# Patient Record
Sex: Female | Born: 1935 | Race: White | Hispanic: No | Smoking: Never smoker
Health system: Southern US, Community
[De-identification: ages and names within clinical notes are randomized; demographics above are authoritative.]

## PROBLEM LIST (undated history)

## (undated) DIAGNOSIS — N816 Rectocele: Secondary | ICD-10-CM

## (undated) DIAGNOSIS — N879 Dysplasia of cervix uteri, unspecified: Secondary | ICD-10-CM

## (undated) DIAGNOSIS — IMO0002 Reserved for concepts with insufficient information to code with codable children: Secondary | ICD-10-CM

## (undated) DIAGNOSIS — K219 Gastro-esophageal reflux disease without esophagitis: Secondary | ICD-10-CM

## (undated) DIAGNOSIS — I10 Essential (primary) hypertension: Secondary | ICD-10-CM

## (undated) DIAGNOSIS — I38 Endocarditis, valve unspecified: Secondary | ICD-10-CM

## (undated) DIAGNOSIS — I639 Cerebral infarction, unspecified: Secondary | ICD-10-CM

## (undated) HISTORY — DX: Cerebral infarction, unspecified: I63.9

## (undated) HISTORY — PX: COLPOSCOPY: SHX161

## (undated) HISTORY — PX: OTHER SURGICAL HISTORY: SHX169

## (undated) HISTORY — DX: Dysplasia of cervix uteri, unspecified: N87.9

## (undated) HISTORY — PX: APPENDECTOMY: SHX54

## (undated) HISTORY — PX: BLADDER SUSPENSION: SHX72

## (undated) HISTORY — DX: Gastro-esophageal reflux disease without esophagitis: K21.9

## (undated) HISTORY — DX: Reserved for concepts with insufficient information to code with codable children: IMO0002

## (undated) HISTORY — DX: Endocarditis, valve unspecified: I38

## (undated) HISTORY — PX: TOTAL HIP ARTHROPLASTY: SHX124

## (undated) HISTORY — DX: Rectocele: N81.6

## (undated) HISTORY — DX: Essential (primary) hypertension: I10

## (undated) HISTORY — PX: INGUINAL HERNIA REPAIR: SUR1180

---

## 1989-03-24 HISTORY — PX: TOTAL ABDOMINAL HYSTERECTOMY: SHX209

## 1997-10-12 ENCOUNTER — Ambulatory Visit (HOSPITAL_COMMUNITY): Admission: RE | Admit: 1997-10-12 | Discharge: 1997-10-12 | Payer: Self-pay | Admitting: Otolaryngology

## 1998-12-11 ENCOUNTER — Ambulatory Visit (HOSPITAL_COMMUNITY): Admission: RE | Admit: 1998-12-11 | Discharge: 1998-12-11 | Payer: Self-pay | Admitting: Gastroenterology

## 1998-12-11 ENCOUNTER — Encounter: Payer: Self-pay | Admitting: Gastroenterology

## 1998-12-11 ENCOUNTER — Encounter (INDEPENDENT_AMBULATORY_CARE_PROVIDER_SITE_OTHER): Payer: Self-pay

## 1999-02-27 ENCOUNTER — Other Ambulatory Visit: Admission: RE | Admit: 1999-02-27 | Discharge: 1999-02-27 | Payer: Self-pay | Admitting: Obstetrics & Gynecology

## 1999-07-25 ENCOUNTER — Ambulatory Visit (HOSPITAL_COMMUNITY): Admission: RE | Admit: 1999-07-25 | Discharge: 1999-07-25 | Payer: Self-pay | Admitting: Otolaryngology

## 1999-07-25 ENCOUNTER — Encounter: Payer: Self-pay | Admitting: Otolaryngology

## 1999-08-13 ENCOUNTER — Other Ambulatory Visit: Admission: RE | Admit: 1999-08-13 | Discharge: 1999-08-13 | Payer: Self-pay | Admitting: Otolaryngology

## 1999-10-17 ENCOUNTER — Encounter: Admission: RE | Admit: 1999-10-17 | Discharge: 1999-10-17 | Payer: Self-pay | Admitting: Internal Medicine

## 1999-10-17 ENCOUNTER — Encounter: Payer: Self-pay | Admitting: Internal Medicine

## 1999-11-05 ENCOUNTER — Encounter: Admission: RE | Admit: 1999-11-05 | Discharge: 1999-12-11 | Payer: Self-pay | Admitting: Neurosurgery

## 2000-01-14 ENCOUNTER — Ambulatory Visit (HOSPITAL_COMMUNITY): Admission: RE | Admit: 2000-01-14 | Discharge: 2000-01-14 | Payer: Self-pay | Admitting: Neurosurgery

## 2000-01-14 ENCOUNTER — Encounter: Payer: Self-pay | Admitting: Neurosurgery

## 2000-01-28 ENCOUNTER — Encounter: Payer: Self-pay | Admitting: Neurosurgery

## 2000-01-28 ENCOUNTER — Ambulatory Visit (HOSPITAL_COMMUNITY): Admission: RE | Admit: 2000-01-28 | Discharge: 2000-01-28 | Payer: Self-pay | Admitting: Neurosurgery

## 2000-02-10 ENCOUNTER — Encounter: Payer: Self-pay | Admitting: Neurosurgery

## 2000-02-10 ENCOUNTER — Ambulatory Visit (HOSPITAL_COMMUNITY): Admission: RE | Admit: 2000-02-10 | Discharge: 2000-02-10 | Payer: Self-pay | Admitting: Neurosurgery

## 2000-04-01 ENCOUNTER — Encounter: Payer: Self-pay | Admitting: Neurosurgery

## 2000-04-01 ENCOUNTER — Ambulatory Visit (HOSPITAL_COMMUNITY): Admission: RE | Admit: 2000-04-01 | Discharge: 2000-04-01 | Payer: Self-pay | Admitting: Neurosurgery

## 2000-04-08 ENCOUNTER — Encounter: Admission: RE | Admit: 2000-04-08 | Discharge: 2000-07-07 | Payer: Self-pay | Admitting: Neurosurgery

## 2000-06-09 ENCOUNTER — Encounter: Payer: Self-pay | Admitting: Gastroenterology

## 2000-06-09 ENCOUNTER — Ambulatory Visit (HOSPITAL_COMMUNITY): Admission: RE | Admit: 2000-06-09 | Discharge: 2000-06-09 | Payer: Self-pay | Admitting: Gastroenterology

## 2000-10-05 ENCOUNTER — Encounter: Admission: RE | Admit: 2000-10-05 | Discharge: 2000-10-05 | Payer: Self-pay | Admitting: Orthopedic Surgery

## 2000-10-07 ENCOUNTER — Ambulatory Visit (HOSPITAL_BASED_OUTPATIENT_CLINIC_OR_DEPARTMENT_OTHER): Admission: RE | Admit: 2000-10-07 | Discharge: 2000-10-08 | Payer: Self-pay | Admitting: Orthopedic Surgery

## 2001-03-24 HISTORY — PX: CHOLECYSTECTOMY: SHX55

## 2001-04-06 ENCOUNTER — Other Ambulatory Visit: Admission: RE | Admit: 2001-04-06 | Discharge: 2001-04-06 | Payer: Self-pay | Admitting: Obstetrics and Gynecology

## 2001-05-04 ENCOUNTER — Encounter: Admission: RE | Admit: 2001-05-04 | Discharge: 2001-05-04 | Payer: Self-pay | Admitting: Otolaryngology

## 2001-05-04 ENCOUNTER — Encounter: Payer: Self-pay | Admitting: Otolaryngology

## 2001-06-02 ENCOUNTER — Ambulatory Visit (HOSPITAL_COMMUNITY): Admission: RE | Admit: 2001-06-02 | Discharge: 2001-06-02 | Payer: Self-pay | Admitting: Cardiology

## 2001-07-27 ENCOUNTER — Encounter: Admission: RE | Admit: 2001-07-27 | Discharge: 2001-07-27 | Payer: Self-pay | Admitting: Internal Medicine

## 2001-07-27 ENCOUNTER — Encounter: Payer: Self-pay | Admitting: Internal Medicine

## 2001-07-29 ENCOUNTER — Encounter (INDEPENDENT_AMBULATORY_CARE_PROVIDER_SITE_OTHER): Payer: Self-pay

## 2001-07-29 ENCOUNTER — Inpatient Hospital Stay (HOSPITAL_COMMUNITY): Admission: RE | Admit: 2001-07-29 | Discharge: 2001-08-06 | Payer: Self-pay | Admitting: General Surgery

## 2001-07-30 ENCOUNTER — Encounter: Payer: Self-pay | Admitting: Gastroenterology

## 2001-07-30 ENCOUNTER — Encounter: Payer: Self-pay | Admitting: General Surgery

## 2001-08-02 ENCOUNTER — Encounter: Payer: Self-pay | Admitting: General Surgery

## 2001-08-05 ENCOUNTER — Encounter: Payer: Self-pay | Admitting: General Surgery

## 2002-05-16 ENCOUNTER — Encounter: Payer: Self-pay | Admitting: Orthopedic Surgery

## 2002-05-18 ENCOUNTER — Inpatient Hospital Stay (HOSPITAL_COMMUNITY): Admission: RE | Admit: 2002-05-18 | Discharge: 2002-05-24 | Payer: Self-pay | Admitting: Orthopedic Surgery

## 2002-05-18 ENCOUNTER — Encounter: Payer: Self-pay | Admitting: Orthopedic Surgery

## 2002-05-24 ENCOUNTER — Inpatient Hospital Stay (HOSPITAL_COMMUNITY)
Admission: RE | Admit: 2002-05-24 | Discharge: 2002-05-28 | Payer: Self-pay | Admitting: Physical Medicine & Rehabilitation

## 2002-07-26 ENCOUNTER — Inpatient Hospital Stay (HOSPITAL_COMMUNITY): Admission: EM | Admit: 2002-07-26 | Discharge: 2002-08-01 | Payer: Self-pay | Admitting: Emergency Medicine

## 2002-07-27 ENCOUNTER — Encounter: Payer: Self-pay | Admitting: Gastroenterology

## 2002-07-29 ENCOUNTER — Encounter: Payer: Self-pay | Admitting: Internal Medicine

## 2002-07-30 ENCOUNTER — Encounter: Payer: Self-pay | Admitting: Internal Medicine

## 2003-04-07 ENCOUNTER — Inpatient Hospital Stay (HOSPITAL_COMMUNITY): Admission: RE | Admit: 2003-04-07 | Discharge: 2003-04-12 | Payer: Self-pay | Admitting: Orthopedic Surgery

## 2003-12-27 ENCOUNTER — Other Ambulatory Visit: Admission: RE | Admit: 2003-12-27 | Discharge: 2003-12-27 | Payer: Self-pay | Admitting: Obstetrics and Gynecology

## 2005-01-09 ENCOUNTER — Other Ambulatory Visit: Admission: RE | Admit: 2005-01-09 | Discharge: 2005-01-09 | Payer: Self-pay | Admitting: Obstetrics and Gynecology

## 2005-01-31 ENCOUNTER — Ambulatory Visit (HOSPITAL_COMMUNITY): Admission: RE | Admit: 2005-01-31 | Discharge: 2005-01-31 | Payer: Self-pay | Admitting: Obstetrics and Gynecology

## 2005-12-23 ENCOUNTER — Encounter: Admission: RE | Admit: 2005-12-23 | Discharge: 2006-01-08 | Payer: Self-pay | Admitting: Internal Medicine

## 2006-02-02 ENCOUNTER — Ambulatory Visit (HOSPITAL_COMMUNITY): Admission: RE | Admit: 2006-02-02 | Discharge: 2006-02-02 | Payer: Self-pay | Admitting: Obstetrics and Gynecology

## 2007-02-12 ENCOUNTER — Other Ambulatory Visit: Admission: RE | Admit: 2007-02-12 | Discharge: 2007-02-12 | Payer: Self-pay | Admitting: Obstetrics and Gynecology

## 2007-02-16 ENCOUNTER — Ambulatory Visit (HOSPITAL_COMMUNITY): Admission: RE | Admit: 2007-02-16 | Discharge: 2007-02-16 | Payer: Self-pay | Admitting: Obstetrics and Gynecology

## 2007-02-24 ENCOUNTER — Encounter: Admission: RE | Admit: 2007-02-24 | Discharge: 2007-02-24 | Payer: Self-pay | Admitting: Obstetrics and Gynecology

## 2007-08-03 ENCOUNTER — Encounter: Admission: RE | Admit: 2007-08-03 | Discharge: 2007-08-03 | Payer: Self-pay | Admitting: Internal Medicine

## 2007-09-14 ENCOUNTER — Emergency Department (HOSPITAL_COMMUNITY): Admission: EM | Admit: 2007-09-14 | Discharge: 2007-09-14 | Payer: Self-pay | Admitting: Emergency Medicine

## 2008-02-15 ENCOUNTER — Ambulatory Visit: Payer: Self-pay | Admitting: Obstetrics and Gynecology

## 2008-02-24 ENCOUNTER — Ambulatory Visit: Payer: Self-pay | Admitting: Obstetrics and Gynecology

## 2009-01-18 ENCOUNTER — Encounter: Admission: RE | Admit: 2009-01-18 | Discharge: 2009-01-18 | Payer: Self-pay | Admitting: Obstetrics and Gynecology

## 2009-02-20 ENCOUNTER — Other Ambulatory Visit: Admission: RE | Admit: 2009-02-20 | Discharge: 2009-02-20 | Payer: Self-pay | Admitting: Obstetrics and Gynecology

## 2009-02-20 ENCOUNTER — Ambulatory Visit: Payer: Self-pay | Admitting: Obstetrics and Gynecology

## 2009-03-06 ENCOUNTER — Ambulatory Visit: Payer: Self-pay | Admitting: Obstetrics and Gynecology

## 2010-01-10 ENCOUNTER — Encounter: Admission: RE | Admit: 2010-01-10 | Discharge: 2010-01-10 | Payer: Self-pay | Admitting: Obstetrics and Gynecology

## 2010-01-22 ENCOUNTER — Encounter: Admission: RE | Admit: 2010-01-22 | Discharge: 2010-01-22 | Payer: Self-pay | Admitting: Obstetrics and Gynecology

## 2010-02-26 ENCOUNTER — Ambulatory Visit: Payer: Self-pay | Admitting: Obstetrics and Gynecology

## 2010-04-14 ENCOUNTER — Encounter: Payer: Self-pay | Admitting: Obstetrics and Gynecology

## 2010-08-09 NOTE — Procedures (Signed)
[facility]  Patient:    [name], [name] Visit Number: [id number] MRN: [id number]          Service Type: MED Location: [phone] Attending Physician:  Caleen Essex Dictated by:   Everardo All Madilyn Fireman, M.D. Proc. Date: 07/30/01 Admit Date:  07/29/2001   CC:         Ollen Gross. Vernell Morgans, M.D.   Procedure Report  PROCEDURE:  Endoscopic retrograde cholangiopancreatography.  INDICATION FOR PROCEDURE:  Abdominal pain and nausea with dilated common bile duct on CT scan and gallstones seen as well.  DESCRIPTION OF PROCEDURE:  The patient was placed in the left lateral decubitus position and placed on the pulse monitor with continuous low flow oxygen delivered via nasal cannula. She was sedated with 50 mg IV Demerol and 5 mg IV Versed. The Olympus video side viewing endoscope was advanced blindly into the oropharynx, esophagus, and stomach. The pylorus was traversed and the papilla of Vater located on the medial duodenal wall. It was recessed in a diverticulum and angled to one side making approach with the sphincterotome difficult and this required a lot of awkward positioning with shallow cannulation achieved with the scope in the long scope configuration. Shallow cannulation was achieved and a cholangiogram was obtained which showed diffuse dilatation of internal and external hepatic bile ducts all the way down to the ampulla with no obvious stone or stricture. The papilla of Vater did not look visibly suspicious for neoplasm. I was unable to gain deep access with the wire or the sphincterotome. The scope was withdrawn back into the stomach and Dr. Stevphen Meuse reviewed the cholangiograms and took still films and did not see any filling defect of the gallbladder and pancreatic duct never filled. I made another attempt at cannulating the common bile duct in the short scope position but this was unsuccessful. In addition, the patient vomited two or three times during  the procedure requiring suctioning and I decided it was in her best interest to terminate the procedure; particularly based on the liver function tests. The scope was then withdrawn and the patient returned to the recovery room in stable condition. She tolerated the procedure well and there were no immediate complications.  IMPRESSION:  Diffusely dilated intra- and extrahepatic bile ducts, no filling defects seen.  PLAN:  Will discuss with Dr. Carolynne Edouard who will probably proceed with cholecystectomy and possibly repeat cholangiogram.  ADDENDUM:  I spoke with Dr. Stevphen Meuse and he reviewed the outside CT scan and felt that the suggestion of impacted stone was a soft finding and also noted that there was a CT scan from two years ago which also showed dilatation of the common bile duct. Given the current dilatation and elevated liver function tests, I suspect she has some benign form of a dilated common bile duct without clinically significant obstruction. Dictated by:   Everardo All Madilyn Fireman, M.D. Attending Physician:  Caleen Essex DD:  07/30/01 TD:  07/31/01 Job: [id number]

## 2010-08-09 NOTE — Discharge Summary (Signed)
[facility]  Patient:    [name], [name] Visit Number: [id number] MRN: [id number]          Service Type: MED Location: [phone] Attending Physician:  Caleen Essex Dictated by:   Sheppard Plumber Earlene Plater, M.D. Admit Date:  07/29/2001 Discharge Date: 08/06/2001   CC:         Fayrene Fearing L. Randa Evens, M.D.  John C. Madilyn Fireman, M.D.   Discharge Summary  DISCHARGE DIAGNOSIS:  Acute and chronic cholecystolithiasis and choledocholithiasis.  PROCEDURES: 1. Endoscopic retrograde cholangiopancreatography on May 9. 2. Laparoscopic cholecystectomy on May 12. 3. Endoscopic retrograde cholangiopancreatography on May 15.  HISTORY OF PRESENT ILLNESS:  Please see the details of the History and Physical.  The patient was known to have cholecystolithiasis and was felt to have partial or complete obstruction of the ampulla and biliary duct.  LABORATORY DATA AND X-RAY FINDINGS:  Upon admission, her laboratory data noted hemoglobin and hematocrit slightly low at 11.6 and 32.7.  Her liver function studies were actually completely normal.  Albumin was low at 3.0.  HOSPITAL COURSE:  She underwent ERCP apparently without significant findings by Dr. Madilyn Fireman and because of the weekend and scheduling, surgery could not be accomplished until May 12.  She was seen by cardiology for evaluation.  On May 12, she underwent laparoscopic cholecystectomy with intraoperative cholangiogram with findings of multiple common duct stones and we recommended followup ERCP.  That was done on May 15, by Dr. Madilyn Fireman and his procedure is noted.  Repeat laboratory data prior to the second ERCP was about the same as the first without abnormal liver function studies.  She made a smooth and uneventful recovery and was ready for discharge on May 16.  FOLLOWUP:  She will be seen in followup as an outpatient. Dictated by:   Sheppard Plumber Earlene Plater, M.D. Attending Physician:  Caleen Essex DD:  08/18/01 TD:  08/20/01 Job:  [id number]

## 2010-08-09 NOTE — Procedures (Signed)
[facility]  Patient:    [name], [name] Visit Number: [id number] MRN: [id number]          Service Type: MED Location: [phone] Attending Physician:  Caleen Essex Dictated by:   Everardo All Madilyn Fireman, M.D. Proc. Date: 08/05/01 Admit Date:  07/29/2001 Discharge Date: 08/06/2001   CC:         Ollen Gross. Vernell Morgans, M.D.   Procedure Report  PROCEDURE:  Endoscopic retrograde cholangiopancreatography with sphincterotomy.  INDICATION FOR PROCEDURE:  The patient undergoing laparoscopic cholecystectomy with intraoperative cholangiogram revealing some stones spilling out of the cystic duct, and patient had a significantly dilated common bile duct.  She had an ERCP prior to the surgery, but no stones were seen, and it was felt that because of this and normal liver function tests, that sphincterotomy was not indicated at the time.  DESCRIPTION OF PROCEDURE:  The patient was placed in prone decubitus position and placed on the pulse monitor with continuous low-flow oxygen delivered by nasal cannula.  She was sedated with 50 mg IV Versed and 5 mg IV Versed.  The Olympus video side-viewing endoscope was advanced blindly to the oropharynx and esophagus and stomach.  The pylorus was traversed and the papilla of Vater located on the medial duodenal wall.  There were diverticula on either side as seen on the previous study.  Deep cannulation was obtained with the Wilson-Cook sphincterotome and a guidewire and a cholangiogram obtained.  This showed a diffusely dilated duct as before with no obvious stones.  An approximately 1 cm sphincterotomy was then made, and this resulted in brisk bleeding which appeared arterial with pulsations initially.  I decided to go ahead and pull a balloon through the duct, and this was done, but I could not see any obvious stones delivered, although this may have been obscured by blood in the duodenum.  After that, two injections of 1:10,000  epinephrine, one with 1.5 cc and another with 1.0 cc were made on either side of the papilla, and the bleeding stopped.  Also, clear bile was seen to exit from the papilla once the bleeding did stop.  He was observed for several minutes, and no further bleeding ensured.  The scope was then withdrawn, and the patient returned to the recovery room in stable condition.  She tolerated the procedure well, and there were no immediate complications.  IMPRESSION:  Dilated common bile duct with presumed common bile duct stone, status post sphincterotomy with immediate postsphincterotomy bleeding, requiring epinephrine injection.  PLAN:  Will watch carefully overnight for any instability or recurrent bleeding and if stable, consider discharge in the morning. Dictated by:   Everardo All Madilyn Fireman, M.D. Attending Physician:  Caleen Essex DD:  08/05/01 TD:  08/07/01 Job: [id number]

## 2010-08-09 NOTE — Op Note (Signed)
[name], [name]                            ACCOUNT NO.:  [id number]   MEDICAL RECORD NO.:  [id number]                   PATIENT TYPE:  INP   LOCATION:  0473                                 FACILITY:  [facility]   PHYSICIAN:  Georges Lynch. Darrelyn Hillock, M.D.             DATE OF BIRTH:  1919-08-05   DATE OF PROCEDURE:  04/07/2003  DATE OF DISCHARGE:                                 OPERATIVE REPORT   SURGEON:  Georges Lynch. Darrelyn Hillock, M.D.   ASSISTANT:  Ronnell Guadalajara, M.D.   PREOPERATIVE DIAGNOSIS:  Severe degenerative arthritis of the left hip.   POSTOPERATIVE DIAGNOSIS:  Severe degenerative arthritis of the left hip.   OPERATION/PROCEDURE:  Left total hip arthroplasty utilizing the [employer]  system.  I cemented the femoral component only.  The sizes used were a size  50 PSL microstructured cup with two screws for fixation.  I utilized a 10-  degree polyethylene insert.  The cement plug was a size 3.  The femoral  component was a size 6 EON plus cemented stem and the C-tapered head was a  32 mm diameter C-tapered head of plus 5.  The polyethylene insert was a 32  mm diameter insert.   DESCRIPTION OF PROCEDURE:  Under general anesthesia, routine orthopedic prep  and draping of the left hip was carried out.  The patient had 1 g of IV  vancomycin preoperatively.  At this particular time and incision was made  over the posterolateral of the left hip.  Bleeders identified and  cauterized.  The self-retaining retractors were inserted and the incision  was carried down through the iliotibial band.  Gluteal muscles were  dissected by blunt dissection with great care taken not to injure the  underlying sciatic nerve.  At this time I detached the external rotators.  I  did a capsulectomy, dislocated the hip and amputated the femoral head at the  appropriate neck length.  I then carried out my reaming and rasping of the  femoral shaft up to a size 6 EON plus stem.  The distal cement spacer was a  3 mm  spacer that was inserted after we thoroughly waterpicked out the  femoral shaft.  At this time we then reamed the acetabulum up to a size 50  mm cup.  We then went through our trials and selected a 50 mm PSL cup.  We  inserted the cup and then affixed the cup with two screws.  We inserted  trial liners for positional purposes and went through trials once again.  We  then inserted our 10-degree polyethylene insert at the appropriate angle.  We then went through trials again with a plus 0 and a plus 5 C-tapered head.  The plus 5 C-tapered head was very stable.  The plus 0 was not.  Following  this, we then cemented our femoral component in the usual fashion.  We first  inserted  a adrenalin-soaked sponge and then removed that and dried the  femoral canal out.  We then pressurized our cement, inserted the femoral  component.  We then went through trials once again and selected a plus 5 C-  tapered head 32 mm in diameter.  Note the hip then was reduced.  We took the  hip through motion and the hip was stable.  We did use vancomycin in the  cement.  Following that, we thoroughly irrigated out the hip and then closed  the hip in layers in the usual fashion.  Skin was closed with metal staples.  A sterile Neosporin dressing was applied.                                               Ronald A. Darrelyn Hillock, M.D.    RAG/MEDQ  D:  04/07/2003  T:  04/07/2003  Job:  [id number]

## 2010-08-09 NOTE — Discharge Summary (Signed)
[name], [name]                            ACCOUNT NO.:  [id number]   MEDICAL RECORD NO.:  [id number]                   PATIENT TYPE:  IPS   LOCATION:  4146                                 FACILITY:  [facility]   PHYSICIAN:  Erick Colace, M.D.           DATE OF BIRTH:  [birth year]   DATE OF ADMISSION:  05/24/2002  DATE OF DISCHARGE:  05/28/2002                                 DISCHARGE SUMMARY   DISCHARGE DIAGNOSES:  1. Right total hip replacement.  2. Hypokalemia, resolving.  3. Hypertension.  4. Mild postoperative anemia.   HISTORY OF PRESENT ILLNESS:  The patient is an 77 year old female with a  history of hypertension, GI bleed, and DJD of right hip for two years.  X-  rays with AVN.  The patient elected to undergo right total hip replacement  on May 18, 2002, by Windy Fast A. Darrelyn Hillock, M.D.  Postoperatively, the  patient was touch down weightbearing and on Coumadin for DVT prophylaxis.  He has had to receive two units of packed red blood cells postoperatively  for postoperative anemia.  PT initiated.  The patient is at close  supervision for transfers and close supervision for ambulating 80 feet with  a rolling walker.   PAST MEDICAL HISTORY:  Significant for:  1. Hypertension.  2. Allergies.  3. MVP.  4. GERD.  5. Diverticulosis.  6. Incontinence.  7. DJD.  8. Frequency with nocturia.  9. Cholecystectomy.   ALLERGIES:  PENICILLIN, ASPIRIN, and QUINONES.   SOCIAL HISTORY:  The patient lives alone in a one-level home with two to  three steps at entry.  She was independent prior to admission.  She does not  use any tobacco or alcohol.   HOSPITAL COURSE:  The patient was admitted to rehabilitation on April 25, 2002, for inpatient therapies to consist of PT and OT daily.  Post  admission, she was maintained on Coumadin for DVT prophylaxis.  The patient  was on Keflex at admission for questionable wound prophylaxis.  As no  drainage or erythema was noted,  this was discontinued 48 hours post  admission.  Labs done post admission showed the patient to have a panic  potassium level at 2.0 with a recheck showing it coming back at 2.2.  A CBC  done showed H&H stable with hemoglobin 11.0 and hematocrit 31.7.  Attempts  at supplementing hypokalemia with runs of potassium were unsuccessful  secondary to extreme discomfort expressed by the patient.  She was  supplemented with p.o. potassium x 2 days with the last BMP on May 27, 2002, showing sodium 134, potassium 3.3, chloride 99, CO2 27, BUN 16,  creatinine 1.0, and glucose 85.  The patient is to continue with potassium  at 20 mEq b.i.d. post discharge and to follow up with LMD for further  monitoring of her hypokalemia.  The patient's hydrochlorothiazide was placed  on hold for 48  hours, however, the blood pressure was noted to be bounding  with systolics in the 180s.  Therefore this was resumed on discharge.  Also,  her Benicar, which was being substituted with Avapro, is to be resumed post  discharge.  The patient is to follow-up blood pressures also checked by LMD.  During her stay in rehabilitation, the patient made good progress.  She did  progress to being at modified independent levels for ADLs, modified  independent for transfers, and modified independent for ambulating short  distances.  Further follow-up therapies to include home health PT by [facility].  A home health R.N. has been arranged for pro time  next on May 29, 2001.  Coumadin to continue until June 16, 2002, to  complete the DVT prophylaxis course.   DISPOSITION:  On May 28, 2002, the patient was discharged to home.   DISCHARGE MEDICATIONS:  1. Atenolol 25 mg p.o. per day.  2. Benicar 40 mg per day.  3. K-Dur 20 mEq b.i.d.  4. Trinsicon one p.o. b.i.d.  5. Coumadin 3 mg alternating with 2 mg every other day.  6. Prilosec 20 mg a day.  7. Os-Cal Plus D one t.i.d.  8. Darvocet-N 100 one to two  q.4-6h. p.r.n. pain.  9. Singulair 10 mg per day.  10.      Prazosin 5 mg q.h.s.  11.      Clonidine 0.1 mg q.h.s.   ACTIVITY:  Partial weightbearing to right lower extremity.  Use walker.  Follow right total hip precautions.   DIET:  Regular.   WOUND CARE:  Keep area clean and dry.   SPECIAL INSTRUCTIONS:  No alcohol, no smoking, and no driving.  No aspirin,  aspirin products, or NSAIDs while on Coumadin.  [facility] PT and  R.N. to follow up for home therapies, as well as pro time draws.    FOLLOW-UP:  The patient is to follow up with Windy Fast A. Gioffre, M.D., in  three to five days for staple removal.  Follow up with Georgianne Fick,  M.D., for recheck of potassium and BP.  Follow up with Erick Colace,  M.D., as needed.     Greg Cutter, P.A.                    Erick Colace, M.D.    PP/MEDQ  D:  05/27/2002  T:  05/29/2002  Job:  [id number]   cc:   Georgianne Fick, M.D.  [address]  [place]  [place] [address]  Fax: [phone]   Georges Lynch. Darrelyn Hillock, M.D.  [address]  [place]  [place] [address]  Fax: [phone]

## 2010-08-09 NOTE — Op Note (Signed)
[facility]  Patient:    [name], [name] Visit Number: [id number] MRN: [id number]          Service Type: MED Location: [phone] Attending Physician:  Caleen Essex Dictated by:   Sheppard Plumber Earlene Plater, M.D. Proc. Date: 08/02/01 Admit Date:  07/29/2001   CC:         Jaclyn Prime. Lucas Mallow, M.D.  [facility] at [facility].  John C. Madilyn Fireman, M.D.  Sandria Bales. Ezzard Standing, M.D.   Operative Report  PREOPERATIVE DIAGNOSIS:  Chronic cholecystolithiasis.  POSTOPERATIVE DIAGNOSES:  Chronic cholecystolithiasis and choledocholithiasis.  OPERATION PERFORMED:  Laparoscopic cholecystectomy and operative cholangiogram.  SURGEON:  Timothy E. Earlene Plater, M.D.  ASSISTANT:  Zigmund Daniel, M.D.  ANESTHESIA:  CRNA supervised [place].  DESCRIPTION OF PROCEDURE:  The patient was seen and evaluated in the preop area by anesthesia, taken back to the operating room, placed supine, general endotracheal anesthesia administered. PAS hose applied. The abdomen was scrubbed, prepped and draped in the usual fashion. A vertical infraumbilical incision made, the fascia identified and opened vertically, the peritoneum entered without complication. Hasson catheter placed, tied in placed with a #1 Vicryl. The abdomen insufflated, peritoneoscopy was unremarkable except for one stringy omental adhesion to the inferior and left of the umbilicus. As well there was a moderate right inguinal hernia. The gallbladder was dilated but was not acutely inflamed. A second 10 mm trocar was placed in the mid epigastrium and two 5 mm trocars in the right upper quadrant. The one stringy omental adhesion was cauterized and divided. Attention was turned to the gallbladder, it was grasped, placed on tension and careful dissection at the base of the gallbladder revealed a very large and redundant gallbladder and we carefully dissected further and further down the infundibulum of the gallbladder until we  reached what was obviously the cystic duct as it narrowed and then entered the common duct. One single cystic artery anteriorly had been seen and divided after clipping. A clip was placed on the gallbladder side of the cystic duct, a small incision made and using the Riddick catheter, the cystic duct remnant was cannulated. Then using real-time fluoroscopy, 1/2 strength Hypaque was injected. The biliary system was known to be quite dilated and it took actually 60 cc of dilute dye to fill the bile duct system. No obvious other abnormalities were noted; however, I could not get the dye to pass into the duodenum. So I removed the Riddick catheter and as I did we watched innumerable small stone fragments wash out of the common duct as well many plugs of dirty mucous bile stained material washed out of the cystic duct. The cystic duct was then irrigated with the Riddick catheter but I could not get the Riddick catheter to pass through the entire length of the cystic duct. So we pressed and squeezed the common duct areas with blunt instruments as best we could. When this was completed, the remnant of the cystic duct was triply clipped, the duct was fully divided, and then the gallbladder was placed on tension and then removed from the gallbladder bed. One additional posterior artery was seen, clipped and divided. The gallbladder was placed in the endocatch bag and then removed through the infraumbilical incision. An additional stitch was placed there to secure closure of that under direct vision as well. Copious irrigation was carried out until clear. There was no bleeding or bile from the gallbladder bed area. All irrigant, instruments, and trocars were removed under  direct vision. The skin incisions closed with 3-0 monocryl. Steri-Strips applied. Counts were correct. She tolerated it well and was awakened and taken to the recovery room in good condition. Dictated by:   Sheppard Plumber Earlene Plater,  M.D. Attending Physician:  Caleen Essex DD:  08/02/01 TD:  08/03/01 Job: [id number]

## 2010-08-09 NOTE — H&P (Signed)
[name], [name]                              ACCOUNT NO.:  [id number]   MEDICAL RECORD NO.:  [id number]                    PATIENT TYPE:   LOCATION:                                       FACILITY:   PHYSICIAN:  Georges Lynch. Gioffre, M.D.             DATE OF BIRTH:  06/04/1919   DATE OF ADMISSION:  DATE OF DISCHARGE:                                HISTORY & PHYSICAL   HISTORY:  The patient has been having right hip pain over the past one to  two years.  Her hip pain has become progressive in nature.  She walks with  an antalgic gait.  She has increasing pain with ascending and descending  stairs.  She has no relief with nonsteroidal anti-inflammatory medications.  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  The  patient has decided to proceed with a right total hip arthroplasty.   ALLERGIES:  PENICILLIN causes redness, ASPIRIN causes a GI bleed, QUININE  causes redness.   Primary care Wen Merced is [name], M.D. with [facility].   PAST MEDICAL HISTORY:  1. Hypertension.  2. Allergies.  3. Skin.  4. Mitral valve prolapse.  5. Gastroesophageal reflux disease.  6. Urinary incontinence.  7. Diverticulitis.  8. Hiatal hernia.  9. Osteoporosis.  10.      Osteoarthritis.  11.      Degenerative disk disease.   CURRENT MEDICATIONS:  1. Atenolol 25 mg daily.  2. Benicar 40 mg daily.  3. Clonidine 0.1 mg daily.  4. Prazosin 5 mg daily.  5. Premarin 0.45 mg daily.  6. Prilosec 40 mg daily.  7. Singulair 10 mg daily.  8. HCTZ 25 mg daily.   FAMILY HISTORY:  Father had heart disease as well as hypertension.  Sister  also has hypertension.  Mother had cardiovascular disease and lymphoma.   REVIEW OF SYSTEMS:  GENERAL:  Denies weight change, fevers, chills, fatigue.  HEENT:  Denies headache, visual changes, tinnitus, hearing loss, sore  throat, hoarseness.  CARDIOVASCULAR:  Denies chest pain, palpitations,  shortness of breath, orthopnea.   PULMONARY:  Denies dyspnea, wheezing,  cough, sputum production, hemoptysis.  GASTROINTESTINAL:  Denies dysphagia,  nausea, vomiting, hematemesis, or abdominal pain.  GENITOURINARY:  Denies  dysuria, frequency, urgency incontinence, hematuria.  ENDOCRINE:  Denies  polyuria, polydipsia, appetite change, heat or cold intolerance.  MUSCULOSKELETAL:  The patient does have right hip pain, painful range of  motion.  NEUROLOGIC:  Denies dizziness, vertigo, syncope, seizure.  SKIN:  Denies itching, rashes, masses, or moles.   PHYSICAL EXAMINATION:  VITAL SIGNS:  Temperature 98.4, pulse 76,  respirations 18, blood pressure 120/80 right arm sitting.  GENERAL:  Well-developed, well-nourished 77 year old white female in no  acute distress.  HEENT:  PERRL.  EOMs intact.  Pharynx clear.  TMs intact.  NECK:  Supple without masses.  No carotid bruits detected.  CHEST:  Clear to auscultation bilaterally with no wheezing, rales, or  rhonchi noted.  HEART:  Regular rate and rhythm without murmur, rub, or gallop.  ABDOMEN:  Positive bowel sounds, soft, nontender.  No organomegaly or  abnormal masses.  EXTREMITIES:  Decreased range of motion right hip, painful limited range of  motion.  SKIN:  Warm and dry.   LABORATORIES:  X-ray of her right hip reveals aseptic necrosis.   IMPRESSION:  Severe degenerative arthritis of right hip.   PLAN:  The patient is to be admitted to [facility] May 17, 2002 to undergo a right total hip arthroplasty.     Ebbie Ridge. Paitsel, P.A.                     Ronald A. Darrelyn Hillock, M.D.    Tilden Dome  D:  05/11/2002  T:  05/11/2002  Job:  [id number]

## 2010-08-09 NOTE — Consult Note (Signed)
[name], [name]                            ACCOUNT NO.:  [id number]   MEDICAL RECORD NO.:  [id number]                   PATIENT TYPE:  INP   LOCATION:  0164                                 FACILITY:  [facility]   PHYSICIAN:  Danise Edge, M.D.                DATE OF BIRTH:  [birth year]   DATE OF CONSULTATION:  07/26/2002  DATE OF DISCHARGE:                                   CONSULTATION   REPORT TITLE:  GASTROENTEROLOGY CONSULTATION.   REFERRING PHYSICIAN:  Georgianne Fick, M.D.   REASON FOR CONSULTATION:  Rule out choledocholithiasis.   HISTORY:  The patient is an 77 year old female, born 04/10/1919.  She  underwent a laparoscopic cholecystectomy in May 2003.  Her operative  cholangiogram was consistent with common bile duct stones despite the  patient having a normal preoperative liver profile.  Endoscopic retrograde  cholangiopancreatography with endoscopic sphincterotomy was performed by Dr.  Dorena Cookey.  No stones were seen to pass from the common bile duct.   In March 2004, the patient underwent a total hip arthroplasty performed by  Dr. Darrelyn Hillock.  She was on prophylactic Coumadin which has now been stopped.   The patient was admitted to the hospital with generalized abdominal pain,  nausea, vomiting and constipation, which started Jul 24, 2002.  She reports  no gastrointestinal bleeding.  She did undergo a CT scan of the abdomen at  [facility], but we have been unable to obtain a report from her  examination performed on Jul 26, 2002.   ALLERGIES:  1. PENICILLIN.  2. ASPIRIN.  3. QUININE.   CHRONIC MEDICATIONS:  1. Atenolol.  2. Benicar.  3. Clonidine.  4. Prazosin.  5. Premarin.  6. Prilosec.  7. Singulair.  8. Hydrochlorothiazide.   PAST MEDICAL HISTORY:  1. On Aug 02, 2001, laparoscopic cholecystectomy with choledocholithiasis by     operative cholangiogram.  2. On Aug 05, 2001, endoscopic retrograde cholangiopancreatography with  endoscopic sphincterotomy, performed by Dr. Dorena Cookey.  3. Colonic diverticulosis.  4. Hypertension.  5. Osteoarthritis, osteoporosis.  6. Appendectomy.  7. Abdominal adhesiolysis.  8. Hysterectomy.  9. Urinary bladder repair.  10.      Inguinal herniorrhaphy.  11.      Tonsillectomy.  12.      Total right hip arthroplasty, March 2004.  13.      Mitral valve prolapse.  14.      Gastroesophageal reflux disease.   HABITS:  The patient does not smoke cigarettes or consume alcohol.   FAMILY HISTORY:  Noncontributory.   PHYSICAL EXAMINATION:  GENERAL:  The patient is alert and appears quite  comfortable lying on her stretcher in the [facility] Emergency Room.  HEENT:  Nonicteric sclerae.  Normal conjunctivae.  LUNGS:  Clear to auscultation.  CARDIAC:  Regular rhythm without murmurs despite her diagnosis of mitral  valve prolapse.  ABDOMEN:  Soft,  flat and nontender.  SKIN:  Warm and dry.   LABORATORY DATA:  CT scan of the abdomen, performed Jul 26, 2002, with report  pending.   CBC normal.  Admission complete metabolic profile reveals rather severe  hyponatremia (sodium 110) and hypokalemia.  Liver profile, amylase, lipase,  and urinalysis are normal.   ASSESSMENT:  1. Abdominal pain, nausea and vomiting; rule out choledocholithiasis.  2. Volume contraction with hyponatremia and hypokalemia.    RECOMMENDATIONS:  1. Intravenous saline resuscitation and potassium deficit replacement.  2. ERCP tentatively scheduled for Jul 27, 2002.  3. Results from her CT scan of the abdomen and pelvis will be obtained prior     to the procedure.                                               Danise Edge, M.D.    MJ/MEDQ  D:  07/26/2002  T:  07/27/2002  Job:  [id number]   cc:   Georgianne Fick, M.D.  [address]  [place]  [place] [address]  Fax: [phone]   Everardo All. Madilyn Fireman, M.D.  [address]  [place]  [place] [address]  Fax: [phone]

## 2010-08-09 NOTE — H&P (Signed)
NAME:  [name], [name]                            ACCOUNT NO.:  [id number]   MEDICAL RECORD NO.:  [id number]                   PATIENT TYPE:  INP   LOCATION:  0164                                 FACILITY:  [facility]   PHYSICIAN:  Lemmie Evens, M.D.             DATE OF BIRTH:  Sep 12, 1919   DATE OF ADMISSION:  07/26/2002  DATE OF DISCHARGE:                                HISTORY & PHYSICAL   HISTORY OF PRESENT ILLNESS:  The patient is an 77 year old white female who  lives alone, a patient of Dr. Nicholos Johns, who came to the emergency room  today complaining of persistent vomiting for the last 24 to 48 hours.  The  patient has vomited green-yellow material on several occasions.  Also, she  has had upper abdominal pain.  Apparently, she has had a previous history of  this related to gallbladder disease.  She did undergo cholecystectomy  several months ago.  She was told at that time that there was evidence of  biliary gallstones.   The patient has been followed by Dr. Nicholos Johns for some time.  She does  have a past medical history of hypertension, osteoarthritis.   REVIEW OF SYMPTOMS:  She has not had significant chest pain, shortness of  breath, cough, fevers, sweats, or chills, peripheral numbness, or joint  swelling.   CURRENT MEDICATIONS:  1. Atenolol 25 mg daily.  2. Singular.  3. Zosyn.  4. Premarin.  5. Hydrochlorothiazide 25 mg daily.  6. Clonidine 0.1 mg.  7. Benicar 40 mg daily.   ALLERGIES:  1. PENICILLIN.  2. ASPIRIN.  3. QUININE SULFATE.   PAST SURGICAL HISTORY:  1. Total abdominal hysterectomy.  2. Right hip replacement.  3. Repair of rectal prolapse.  4. Cholecystectomy.   LABORATORY DATA:  In the emergency room it was noted that the patient did  have a low potassium of 2.3, sodium of 110, creatinine of 0.6, SGPT of 12,  alkaline phosphatase of 97, bilirubin of 1.1, hemoglobin of 14.1, white  blood cell count of 10,200.   PHYSICAL EXAMINATION:   GENERAL:  The patient appeared to be fairly healthy  and well-nourished.  VITAL SIGNS:  Temperature was 97.1, blood pressure was 183/94, respiratory  rate was 20.  HEENT:  Examination of the mouth did reveal evidence of dryness of the  buccal mucosa.  NECK:  No thyromegaly or adenopathy.  LUNGS:  Clear breath sounds bilaterally without rales.  HEART:  Normal sinus rhythm, heart rate is 70.  No murmurs, rubs, or gallops  appreciated.  ABDOMEN:  Some upper abdominal tenderness without rebound.  No  hepatosplenomegaly was noted.  Bowel sounds were active throughout the  abdomen.  EXTREMITIES:  Good range of motion of upper and lower extremities.  There is  good strength of proximal muscle groups of the hip and shoulder.  NEUROLOGIC:  Intact with good muscle strength.  She was  quite alert and  responsive.   ASSESSMENT:  The patient has had a history of vomiting, nausea, and upper  abdominal pain with a CT scan on the day of admission that suggested a  common duct stone with dilation of the common duct.   PLAN:  She is being admitted for evaluation of this, and treatment of  hypokalemia related to vomiting.                                                  Lemmie Evens, M.D.    [place] Pink  D:  07/26/2002  T:  07/27/2002  Job:  [id number]

## 2010-08-09 NOTE — Consult Note (Signed)
[facility]  Patient:    [name], [name] Visit Number: [id number] MRN: [id number]          Service Type: MED Location: [phone] Attending Physician:  Caleen Essex Dictated by:   Llana Aliment. Randa Evens, M.D. Proc. Date: 07/29/01 Admit Date:  07/29/2001   CC:         Ollen Gross. Vernell Morgans, M.D.   Consultation Report  DATE OF BIRTH:  [birth year]  REASON FOR CONSULTATION:  Impacted common duct stone.  HISTORY:  A 77 year old woman who has been sick for several weeks.  She has lost weight.  She has vomited on several occasions.  Has had at least two distinct attacks of severe pain.  One right before Easter got some better and then has had several minor attacks and then a major attack this week.  She had seen Dr. Arlyce Dice prior to this and had called and was told she had diverticulitis because apparently she did have a history of diverticular disease and Cipro and Flagyl were called in.  This did not help, and in fact, made her more nauseous.  She had a CT scan done at [facility] which is not currently available.  Has been reviewed by Dr. Ailene Ards. Carolynne Edouard and the CT showed gallstones and common duct stone impacted at the ampulla.  Do not currently have any liver function tests.  The patients symptoms are that she has upper abdominal pain and nausea every time that she eats.  She has lost 4-5 pounds over the past two weeks.  She was admitted for cholecystectomy and ERCP.  CURRENT MEDICATIONS:  Cipro and Flagyl.  She takes hydrochlorothiazide, clonidine, prazosin.  Has been vomiting these up recently.  She also takes atenolol.  ALLERGIES:  PENICILLIN, ASPIRIN, QUININE.  PAST MEDICAL HISTORY:  She has a history of leaking heart valves which are apparently not serious.  Sees Dr. Aggie Cosier for this.  She had a colonoscopy in the past by Dr. Arlyce Dice revealing diverticulosis.  She has fairly severe degenerative joint disease.  She has had rectal prolapse repair,  appendectomy, tonsillectomy, inguinal hernia repair, hysterectomy, and herniorrhaphy.  FAMILY HISTORY:  Mother had lymphatic cancer.  Daughter had uterine cancer. Family members have had gallstones.  PHYSICAL EXAMINATION  VITAL SIGNS:  Patient is afebrile.  Vital signs are pending.  GENERAL:  Pleasant white female in no acute distress.  HEENT:  Eyes:  Sclerae:  Non-icteric.  NECK:  Supple.  LUNGS:  Clear.  HEART:  Regular rate and rhythm without murmurs or gallops.  ABDOMEN:  Nondistended, generally soft with mild tenderness in the upper abdomen.  ASSESSMENT:  Cholelithiasis with impacted common duct stone on CT scan.  I agree preoperative ERCP would be appropriate.  PLAN:  I have discussed this in detail with the patient and all of her family including the risk of bleeding, pancreatitis, failure to ______, etcetera. They wish to go ahead.  We therefore will try to get this scheduled in the morning.  The procedure will be performed by my partner, Dr. Dorena Cookey. Dictated by:   Llana Aliment. Randa Evens, M.D. Attending Physician:  Caleen Essex DD:  07/29/01 TD:  08/02/01 Job: [id number]

## 2010-08-09 NOTE — Discharge Summary (Signed)
[name], [name]                            ACCOUNT NO.:  [id number]   MEDICAL RECORD NO.:  [id number]                   PATIENT TYPE:  INP   LOCATION:  0473                                 FACILITY:  [facility]   PHYSICIAN:  Georges Lynch. Darrelyn Hillock, M.D.             DATE OF BIRTH:  1919-05-29   DATE OF ADMISSION:  04/07/2003  DATE OF DISCHARGE:  04/12/2003                                 DISCHARGE SUMMARY   ADMISSION DIAGNOSES:  1. Degenerative arthritis, left hip.  2. Hypertension.  3. Coronary artery disease.  4. Hiatal hernia.  5. Gastroesophageal reflux disease.  6. Diverticulitis.  7. Hemorrhoids.  8. Urinary incontinence.  9. History of skin cancer.   DISCHARGE DIAGNOSES:  1. Degenerative arthritis, left hip, status post left total hip     arthroplasty.  2. Hypertension.  3. Coronary artery disease.  4. Hiatal hernia.  5. Gastroesophageal reflux disease.  6. Diverticulitis.  7. Hemorrhoids.  8. Urinary incontinence.  9. History of skin cancer.   PROCEDURES:  The patient was taken to the operating room on April 07, 2003, for a left total hip arthroplasty.  Surgeon Worthy Rancher, M.D.  Assistant Ronnell Guadalajara, M.D.  The surgery was performed under general  anesthesia.   CONSULTS:  1. Physical therapy.  2. Occupational therapy.  3. Rehab.   BRIEF HISTORY:  This patient is an 77 year old female, who has had left hip  pain for the past 2-3 years.  She has had increasing pain for the past  several months.  The patient has had known degenerative arthritis of her  left hip but was trying to put off total hip replacement as long as  possible.  The patient had a right total hip replacement in February 2004.  She has done very well since that time and elects to proceed with a left  total hip arthroplasty for pain control.   LABORATORY DATA:  Admission CBC:  WBC 9.0, RBC 3.85, hemoglobin 12.1,  hematocrit 35.4, platelet count 316.  PT 12.9, INR 1.0, PTT 31.  Admission  chemistry:  Sodium slightly low 134, potassium 4.4, chloride 103, CO2 26,  glucose 86, BUN 16, creatinine 0.6, calcium 9.0, albumin 3.5.  Preadmission  urinalysis normal.  The patient's blood type O positive, negative antibody  screen.  Preoperative chest x-ray:  Cardiomegaly with tortuous aorta, no  evidence of infiltrate or congestive failure.  Preoperative x-ray of left  hip:  Complete collapse of the joint space.  Postoperative x-ray of left hip  reveals left total hip prosthesis and good alignment.   HOSPITAL COURSE:  This patient was admitted to [facility] and  taken to the operating room.  She underwent the above-stated procedure  without complication.  She tolerated the procedure well and was allowed to  return to the recovery room then to the orthopedic floor to continue her  postoperative care.  The patient  was placed on PC analgesia for pain  control.  The patient was slowly weaned over to oral analgesics.  Her pain  was well controlled, and her PCA was discontinued on postoperative day  three.  The patient's hemoglobin and hematocrit was followed throughout her  hospitalization.  She had a postoperative drop in her hemoglobin postop day  one to 8.9.  The patient was transfused with 2 units of packed red blood  cells.  The patient's hemoglobin was monitored throughout her  hospitalization, and her hemoglobin stabilized prior to discharge without  requiring additional blood.  PT/OT and rehab was consulted for gait training  ambulation.  The patient progressed very well, and she felt that she could  go home rather than go to rehab.  The patient worked with the therapist and  was able to walk greater than 100 feet by postoperative day five, and it was  felt that she could safely go home.   DISPOSITION:  The patient was discharged home on April 12, 2003.   DISCHARGE MEDICATIONS:  1. Darvocet-N 100, 1-2 q.6h. p.r.n. pain.  2. Coumadin 1 mg daily.   DIET:  As  tolerated.   ACTIVITY:  Touchdown weightbearing with walker.   HOME CARE:  [facility].   FOLLOW UP:  The patient is scheduled to follow up with Dr. Darrelyn Hillock 2 weeks  from the date of surgery.   CONDITION ON DISCHARGE:  Improved.     [name]. Paitsel, P.A.                     Ronald A. Darrelyn Hillock, M.D.    Tilden Dome  D:  05/03/2003  T:  05/03/2003  Job:  [id number]

## 2010-08-09 NOTE — Op Note (Signed)
NAMEBRAYLA, [name]                            ACCOUNT NO.:  [id number]   MEDICAL RECORD NO.:  [id number]                   PATIENT TYPE:  INP   LOCATION:  0001                                 FACILITY:  [facility]   PHYSICIAN:  Georges Lynch. Darrelyn Hillock, M.D.             DATE OF BIRTH:  1919/08/01   DATE OF PROCEDURE:  05/18/2002  DATE OF DISCHARGE:                                 OPERATIVE REPORT   SURGEON:  Georges Lynch. Gioffre, M.D.   ASSISTANTS:  1. Ebbie Ridge. Paitsel, P.A.  2. Ronnell Guadalajara, M.D.   SURGEON:  Georges Lynch. Darrelyn Hillock, M.D.   PREOPERATIVE DIAGNOSIS:  Severe degenerative arthritis of the right hip.   POSTOPERATIVE DIAGNOSIS:  Severe degenerative arthritis of the right hip.   OPERATION:  1. Right total hip arthroplasty.  2. I utilized the [employer] system.  I cemented a femoral component.  The     size of the femoral component was a size 6 cemented prosthesis.  The head     size the C-tapered head was a plus 5 C-tapered head.  The cement plug was     a size 3.  The acetabular cup was a size 48 mm PSL microstructured cup.     I utilized two cancellous bone screws.  The distal cement spacer was a     size 11.   DESCRIPTION OF PROCEDURE:  Under general anesthesia, the patient on the left  side and the right side up, a routine orthopedic prep and draping of the  right hip was carried out.  She had vancomycin 500 mg preop.  After the  prepping and draping of the right hip was carried out, an incision was made  with a posterolateral approach.  Bleeders identified and cauterized.  The  self-retaining retractors were inserted.  The incision was carried down  through the iliotibial band, and the self-retaining retractors were  advanced.  Great care was taken to protect the sciatic nerve.  I then went  down and detached the external rotators at the Zickel band in the usual  fashion.  I then did a capsulectomy, dislocated the head, amputated the  femoral head at the appropriate neck level.   I then inserted my guidepin and  carried out my reaming and rasping up to a size 6 Eon plus type prosthesis.  The distal cement plug was measured to be a size 3.  We thoroughly reamed  and rasped the area out.  After the femur was prepared, we then went on and  completed our capsulectomy, reamed our acetabulum up to a size 48 mm cup.  We then went through trials in the usual fashion and picked out the  appropriate-sized prostheses.  Following this, we then inserted our  permanent acetabular shell, went through the trials once again before we  utilized the screws for fixation.  We had excellent stability of the  prosthesis.  We then inserted our permanent size 48 mm microstructured PSL  cup with two cancellous bone screws.  At this time, we then inserted our  permanent cup, the 10 degree liner.  We then waterpicked out the femoral  canal, inserted an adrenalin-soaked sponge in the canal while the cement was  being mixed.  We added vancomycin to the cement.  After the cement was  prepared, we then removed the adrenalin-soaked sponge and inserted the dry  sponge, dried out the femur.  Prior to that, as I mentioned, we did insert  our size 3 cement spacer.  We then inserted our cement and compressed the  cement in the usual fashion and then inserted out size 6 Eon prosthesis.  After the cement was hardened, we made sure there were no other loose pieces  of cement present.  We went through trials again and selected a plus 6 C-  tapered head.  We affixed the plus 5 C-tapered head to the femoral  component, reduced the hip, and took the hip through motion and had  excellent stability and excellent  motion.  We then thoroughly irrigated out the hip again, inserted a Hemovac  drain, closed the hip in layers in the usual fashion.  The skin was closed  with metal staples.  A sterile Neosporin dressing was applied.  She was  placed in a knee immobilizer postop.  The patient left the operating room  in  satisfactory condition.                                               Ronald A. Darrelyn Hillock, M.D.    RAG/MEDQ  D:  05/18/2002  T:  05/18/2002  Job:  [id number]

## 2010-08-09 NOTE — Op Note (Signed)
[employer]  Patient:    [name], [name]                         MRN: [id number] Proc. Date: 10/07/00 Adm. Date:  [id number] Attending:  Marlowe Shores                           Operative Report  PREOPERATIVE DIAGNOSIS:  Right thumb carpometacarpal degenerative joint disease, as well as first dorsal compartment tenosynovitis.  POSTOPERATIVE DIAGNOSIS:  Right thumb carpometacarpal degenerative joint disease, as well as first dorsal compartment tenosynovitis.  PROCEDURE: 1. Right thumb carpometacarpal suspensionplasty with abductor pollicis longus    tendon transfer from the wrist. 2. Right ______ release.  SURGEON:  Artist Pais. Mina Marble, M.D.  ASSISTANT:  R.N.  ANESTHESIA:  General.  TOURNIQUET TIME:  One hour and 20 minutes.  COMPLICATIONS:  None.  DRAINS:  None.  DESCRIPTION OF PROCEDURE:  The patient was taken to the operating room after induction of adequate general analgesia.  The right upper extremity was prepped and draped in the usual sterile fashion.  An Esmarch was used to exsanguinate the limb.  Tourniquet inflated to 250 mmHg.  At this point in time, a Wagner incision was made over the palmar aspect of the thumb metacarpal with the proximal limb extended down over the SCR insertion.  A volar flap was elevated.  The thenar musculature was identified and carefully subperiosteally dissected off of the [facility] joint.  A longitudinal CMC arthrotomy was performed and the trapezium was carefully dissected subperiosteally.  At this point in time, the trapezium was removed in its entirety using a combination of osteotomes, rongeurs, and curettes.  At this point in time, a transosseous canal was created in the submetacarpal in line with the plane of the nail 1.5 cm from the articular surface dorsally in the midline on the joint surface.  This was created using a combination of a bone awl and hand drills.  After this was done, a  transosseous canal was created in the index metacarpal base from volar to dorsal in the same manner.  Once this was done, a separate incision was made over the dorsoradial aspect of the wrist on the right.  Dissection was carried down to just proximal to the first dorsal compartment.  The first dorsal compartment release was then performed.  The APL tendon was identified and transected at its musculotendinous junction.  It was then drawn under the skin into the distal wound.  At this point in time, the APL tendon was passed from dorsal to volar through the thumb metacarpal transosseous tunnel and then from volar to dorsal out the index metacarpal tunnel through a third incision.  The thumb was then placed in maximum suspension and the APL tendon transfer was then sutured into the ECRB insertion at the index metacarpal base using a Pulvertaft weave x 3 with 3-0 Ethibond suture.  At this point in time, the wounds were thoroughly irrigated.  The capsule and thenar musculature were repaired with 4-0 Vicryl and the skin was then closed x 3 using a 3-0 Prolene subcuticular stitch. Steri-Strips, 4 x 4s, fluffs, and a compressive dressing was applied, as well as a radial gutter splint.  The patient also had 0.25% plain Marcaine injected into the wounds for postoperative pain control.  The patient tolerated the procedure well and went to the recovery room in stable fashion. DD:  10/07/00 TD:  10/07/00 Job: [id number]

## 2010-08-09 NOTE — H&P (Signed)
[facility]  Patient:    [name], [name] Visit Number: [id number] MRN: [id number]          Service Type: MED Location: [phone] Attending Physician:  Caleen Essex Dictated by:   Ollen Gross. Vernell Morgans, M.D. Admit Date:  07/29/2001                           History and Physical  HISTORY OF PRESENT ILLNESS:  The patient is a 77 year old white female with a long history of lower abdominal pain, who recently developed more upper abdominal pain.  She has also had some nausea and a couple episodes of vomiting.  She has been able to keep down some liquids, but has not been able to eat very much and has lost some weight, which she cannot quantify.  She has had some subjective feelings of being chilled, but has not taken her temperature.  She otherwise denies chest pain, shortness of breath, diarrhea, or dysuria.  She has also had some problems with constipation.  She was recently evaluated with a CT scan which showed small stones in her gallbladder, as well as some significant dilation of her intrahepatic and extrahepatic bile ducts and possibly a stone impacted at her ampulla.  She had normal liver functions and a normal white count on the blood work that she presented with to the clinic today.  PAST MEDICAL HISTORY:  Significant for: 1. Diverticulosis. 2. Hypertension. 3. Osteoarthritis.  PAST SURGICAL HISTORY:  Significant for: 1. Lysis of adhesions. 2. Appendectomy. 3. Hysterectomy. 4. Bladder repair. 5. Inguinal hernia repair. 6. Hemorrhoidectomy. 7. Tonsillectomy.  MEDICATIONS: 1. Hyzaar. 2. Prazosin. 3. Imipramine. 4. Premarin. 5. Estrace.  ALLERGIES:  PENICILLIN, QUININE, and ASPIRIN.  SOCIAL HISTORY:  She denies use of alcohol or tobacco products.  FAMILY HISTORY:  Significant for lymphoma, emphysema, and coronary artery disease.  PHYSICAL EXAMINATION:  In general, she is a thin, frail, elderly-appearing, white female in no acute  distress.  SKIN:  Warm and dry with no jaundice.  HEENT:  Her extraocular muscles are intact.  NECK:  She has no bruits.  LUNGS:  Clear bilaterally.  HEART:  Regular rate and rhythm.  ABDOMEN:  Soft with some mild to moderate tenderness in her upper abdominal area.  EXTREMITIES:  No clubbing, cyanosis, or edema.  NEUROLOGIC:  She is alert and oriented x 4.  LYMPHATICS:  I cannot palpate no lymphadenopathy.  ASSESSMENT AND PLAN:  This is a 77 year old white female with possible impacted stone at her ampulla and gallstones.  We will plan to admit her to [facility] this evening.  I have asked James L. Randa Evens, M.D., in gastroenterology to see her for consideration of a possible ERCP.  We will otherwise start her on some IV rehydration and work on her pain control and discuss the possibility of surgery in the near future. Dictated by:   Ollen Gross. Vernell Morgans, M.D. Attending Physician:  Caleen Essex DD:  07/29/01 TD:  07/31/01 Job: [id number]

## 2010-08-09 NOTE — H&P (Signed)
[name], [name]                            ACCOUNT NO.:  [id number]   MEDICAL RECORD NO.:  [id number]                   PATIENT TYPE:  INP   LOCATION:  NA                                   FACILITY:  [facility]   PHYSICIAN:  Georges Lynch. Gioffre, M.D.             DATE OF BIRTH:  [birth year]   DATE OF ADMISSION:  DATE OF DISCHARGE:                                HISTORY & PHYSICAL   HISTORY:  Patient has had left hip pain for the last 2-3 years.  She has had  increased pain for the past several months.  Patient has known degenerative  arthritis of her left hip.  Patient had a right total hip arthroplasty in  February 2004.  She has done very well since that time and elects to proceed  with a left total hip arthroplasty.   DRUG ALLERGIES:  PENICILLIN, ASPIRIN, QUININE   PRIMARY CARE Charlye Spare:  Dr. Nicholos Johns   PAST MEDICAL HISTORY:  1. Hypertension.  2. Coronary artery disease.  3. Hiatal hernia.  4. Gastroesophageal reflux disease.  5. Diverticulitis.  6. Hemorrhoids.  7. Urinary incontinence.  8. Previous history of skin cancer.   CURRENT MEDICATIONS:  1. Atenolol 25 mg daily.  2. Benicar 40 mg daily.  3. Clonidine 0.1 mg q.i.d.  4. Prilosec 40 mg daily.  5. Lasix 20 mg daily.  6. Prazosin 5 mg daily.  7. Premarin 0.45 mg daily.   PREVIOUS SURGICAL HISTORY:  1. Patient had a right hip replacement in February 2004.  2. Cholecystectomy in May 2003.  3. Surgery on the right thumb in July of 2002.  4. Patient had surgery for prolapsed bladder and rectum in 1995.  5. Lens implants 1994.  6. Inguinal hernia repair in 1994.  7. Hysterectomy in 1991.  8. Skin cancer removed for melanoma in 1975.  9. Tonsillectomy in 1943.  10.      Appendectomy in 1936.   FAMILY HISTORY:  Father as well as sister with heart disease.  Mother and  sister with hypertension.  Mother had lymphoma.  Mother and sister  arthritis.  Brother died of emphysema.   REVIEW OF SYSTEMS:  GENERAL:  Denies  weight change, fever, chills, fatigue.  HEENT:  Denies headache, visual changes, tinnitus, hearing loss, sore  throat.  CARDIOVASCULAR:  Denies chest pain, palpitations, shortness of  breath, orthopnea.  PULMONARY:  Denies dyspnea, wheezing, cough, sputum  production, hemoptysis.  GU:  Denies dysuria, frequency, urgency, hematuria.  ENDOCRINE:  Denies polyuria, polydipsia, appetite changes, __________.  NEUROLOGIC:  Denies dizziness, vertigo, syncope, seizures.  SKIN:  Denies  itching, rashes, masses, or moles.   PHYSICAL EXAMINATION:  VITAL SIGNS:  Temperature 98.0, pulse 68,  respirations 18, blood pressure 130/80 right arm sitting.  GENERAL:  This is an 77 year old female in no acute distress.  HEENT:  PERRL, EOMs intact.  Pharynx clear.  NECK:  Supple without  masses.  CHEST:  Clear to auscultation bilaterally.  HEART:  Regular rate and rhythm without murmur.  ABDOMEN:  Positive bowel sounds.  Soft, nontender.  EXTREMITIES:  Patient has on examination of her left hip decreased range of  motion, very painful range of motion.  SKIN:  Warm and dry.   X-ray of her left hip reveals almost complete collapse of the joint space in  the left hip.   IMPRESSION:  Degenerative arthritis left hip.   PLAN:  Patient is to be admitted to [facility] on April 07, 2003, to undergo a left total hip arthroplasty.     Ebbie Ridge. Paitsel, P.A.                     Ronald A. Darrelyn Hillock, M.D.    Tilden Dome  D:  04/05/2003  T:  04/05/2003  Job:  [id number]

## 2010-08-09 NOTE — Discharge Summary (Signed)
NAMEARNELLE, [name]                            ACCOUNT NO.:  [id number]   MEDICAL RECORD NO.:  [id number]                   PATIENT TYPE:  INP   LOCATION:  0470                                 FACILITY:  [facility]   PHYSICIAN:  Georgianne Fick, M.D.            DATE OF BIRTH:  1919-08-07   DATE OF ADMISSION:  07/26/2002  DATE OF DISCHARGE:  08/01/2002                                 DISCHARGE SUMMARY   ADMISSION DIAGNOSES:  1. Hypokalemia.  2. Intractable nausea and vomiting.  3. Hyponatremia.  4. Hypertension.  5. Chronic issues including:     A. History of laparoscopic cholecystectomy with cholelithiasis Aug 02, 2001.     B. Endoscopic retrograde cholangiopancreatography Aug 05, 2001, with        sphincterotomy.     C. Colonic diverticulosis.     D. Hypertension.     E. Osteoarthritis.     F. Osteoporosis.  6. Status post appendectomy.  7. Abdominal adhesiolysis.  8. Hysterectomy.  9. Bladder repair.  10.      Inguinal herniorrhaphy.  11.      Tonsillectomy.  12.      Total right hip arthroplasty March 2004.  13.      Mitral valve prolapse.  14.      Recurrent hyponatremia.   DISCHARGE DIAGNOSES:  1. Hypokalemia, most probably secondary to protracted vomiting combined with     the use of hydrochlorothiazide.  2. Hyponatremia, most likely secondary to contraction from protracted nausea     and vomiting and also possibly related to diuretics.  3. Hypertension, most likely secondary to malabsorption of medications     secondary to vomiting.  4. Abdominal pain.  Cause remains unclear.  The patient's symptoms resolved     spontaneously.  She had to have multiple pain medications during this     stay to control this.   HOSPITAL COURSE:  After being admitted to [facility] with reports  of a CT scan that was abnormal that was ordered out of the office for  evaluation of symptoms that the patient had been seen in the office for.  Upon arriving to the  emergency room she was noted to have significant  electrolyte imbalances, was admitted to intensive care unit.  Treatment of  these conditions was started.  Her abdominal pain as well as nausea and  vomiting were her main symptoms.  These were addressed with various  investigations as well as consultation from gastroenterology service.  Dr.  Danise Edge was consulted.  With this, an MRCP was arranged.  From the  results of this, further treatment was carried out.  The patient gradually  improved during her stay.  Presently she is tolerating solid regular diet  without any complications and will be thus discharged home.   CONSULTATIONS:  Dr. Danise Edge was consulted from the gastrointestinal  service.   LABORATORY INVESTIGATIONS:  Laboratories on discharge revealed WBC of 7.4,  hemoglobin of 10.8, hematocrit of 30.8, platelet count of 331.  BMP revealed  sodium 132, potassium 4, glucose of 111, BUN of 8, creatinine of 0.7.  Random urine sodium was found to be 6.  Random urine potassium was found to  be 6.  Random urinary creatinine was found to be 17.  Serum osmolality was  found to be 237.  Serum lipase and amylase were both negative.  LFTs were  without significant liver abnormalities.   Radiological investigations:  1. HIDA scan:  This revealed no obstruction of the common bile duct.  2. X-ray of the abdomen reveals nonobstructive bowel gas pattern, the     residual colonic contrast delineating several diverticula.  3. MRCP taken Jul 27, 2002, reveals dilated tortuous common bile duct along     with intrahepatic dilation, appears fairly stable when compared to     ultrasound examination from Jul 30, 2001.  There is smooth narrowing of     the distal common duct.  Could not exclude a mild stricture although the     patient has had a previous sphincterotomy.  Pancreatic duct within normal     limits for size.  No common bile duct stones.   DISCHARGE MEDICATIONS:  1. Atenolol 50  mg p.o. daily.  2. Singulair 10 mg daily.  3. Prazocin 5 mg p.o. daily.  4. Clonidine 0.2 mg p.o. three times daily.  5. Benicar 40 mg p.o. daily.   DISCHARGE INSTRUCTIONS:  The patient will follow up with myself in  approximately two days' time.                                               Georgianne Fick, M.D.    AR/MEDQ  D:  08/01/2002  T:  08/02/2002  Job:  [id number]

## 2010-08-09 NOTE — Discharge Summary (Signed)
[name], [name]                            ACCOUNT NO.:  [id number]   MEDICAL RECORD NO.:  [id number]                   PATIENT TYPE:  INP   LOCATION:  0480                                 FACILITY:  [facility]   PHYSICIAN:  Georges Lynch. Darrelyn Hillock, M.D.             DATE OF BIRTH:  11-06-1919   DATE OF ADMISSION:  05/18/2002  DATE OF DISCHARGE:  05/24/2002                                 DISCHARGE SUMMARY   ADMISSION DIAGNOSES:  1. Osteoarthritis right hip.  2. Hypertension.  3. Allergies.  4. Mitral valve prolapse.  5. Urinary incontinence.  6. Diverticulitis.  7. Hiatal hernia.  8. Osteoporosis.  9. Degenerative disk disease.   DISCHARGE DIAGNOSES:  1. Osteoarthritis right hip status post right total hip replacement.  2. Hypertension.  3. Allergies.  4. Mitral valve prolapse.  5. Gastroesophageal reflux disease.  6. Urinary incontinence.  7. Diverticulitis.  8. Hiatal hernia.  9. Osteoporosis.  10.      Degenerative disk disease.   CONSULTS:  Physical therapy, occupational therapy.   PROCEDURE:  The patient was taken to the operating room on May 18, 2002  to undergo a right total hip arthroplasty.  Surgeon Georges Lynch. Darrelyn Hillock, M.D.  Assistants Ronnell Guadalajara, M.D., Ebbie Ridge. Paitsel, P.A.  Surgery was  performed under general anesthesia.  Hemovac drain was placed at the time of  surgery.   BRIEF HISTORY:  This patient is an 77 year old female who has had right hip  pain over the past two years.  Her hip pain has become progressive in  nature.  She walks with an antalgic gait.  She has had increasing pain  descending and ascending stairs.  She no longer has pain relief with  nonsteroidal anti-inflammatory medications.  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  The patient has elected to  proceed with a right total hip arthroplasty.   LABORATORIES:  Pre admission CBC:  WBC 8.1, RBC 4.0, hemoglobin 12.3,  hematocrit 36.0, platelets  316,000.  Pre admission chemistry profile normal  except slightly low sodium at 132.  Pre admission urinalysis revealed trace  hemoglobin, many wbc's, 0-2 rbc's per high powered field.  The patient's  blood type is O+.  Negative antibody screen.  Pre admission x-ray of her  right hip revealed aseptic necrosis femoral head.  Unable to locate pre  admission EKG or chest x-ray in her medical record.  Postoperative x-ray of  her right hip revealed right hip prosthesis in good alignment.   HOSPITAL COURSE:  The patient was admitted to [facility] and taken  to surgery for the above stated procedure.  She tolerated the procedure well  and was allowed to return to the recovery room and then to the orthopedic  floor to continue her postoperative care.  Hemovac drain was placed at the  time of surgery.  It was  pulled on postoperative day one.  She was placed on  PC analgesia for pain control following surgery.  The PCA was given until  postoperative day three when she was weaned over to p.o. analgesics.  Her  hemoglobin and hematocrit were closely monitored throughout her  hospitalization.  She had a drop in her hemoglobin on postoperative day two  to 8.2 and she was transfused with 2 units of packed red cells.  Her  hemoglobin stabilized prior to discharge at 10.6 following her blood  transfusion.  The patient was slow to progress.  Difficulty ambulating with  assistance.  She was transferred to the rehabilitation unit at [facility] on postoperative day number five.   DISPOSITION:  The patient was transferred to the rehabilitation unit on  May 24, 2002.   DIET:  As tolerated.   ACTIVITY:  Hip precautions.  Touchdown weightbearing with walker to right  lower extremity.   FOLLOW UP:  The patient is scheduled to follow up with Windy Fast A. Gioffre,  M.D. two weeks from the date of her surgery when she is discharged from the  rehabilitation unit.   CONDITION ON TRANSFER:   Stable.     Ebbie Ridge. Paitsel, P.A.                     Ronald A. Darrelyn Hillock, M.D.    Tilden Dome  D:  06/09/2002  T:  06/09/2002  Job:  [id number]

## 2011-05-12 DIAGNOSIS — Z8673 Personal history of transient ischemic attack (TIA), and cerebral infarction without residual deficits: Secondary | ICD-10-CM | POA: Insufficient documentation

## 2011-05-12 DIAGNOSIS — I071 Rheumatic tricuspid insufficiency: Secondary | ICD-10-CM | POA: Insufficient documentation

## 2011-05-12 DIAGNOSIS — I10 Essential (primary) hypertension: Secondary | ICD-10-CM | POA: Insufficient documentation

## 2011-05-20 ENCOUNTER — Ambulatory Visit (INDEPENDENT_AMBULATORY_CARE_PROVIDER_SITE_OTHER): Payer: Medicare Other | Admitting: Obstetrics and Gynecology

## 2011-05-20 ENCOUNTER — Encounter: Payer: Self-pay | Admitting: Obstetrics and Gynecology

## 2011-05-20 VITALS — BP 140/76 | Ht <= 58 in | Wt 105.0 lb

## 2011-05-20 DIAGNOSIS — Z1272 Encounter for screening for malignant neoplasm of vagina: Secondary | ICD-10-CM

## 2011-05-20 DIAGNOSIS — K219 Gastro-esophageal reflux disease without esophagitis: Secondary | ICD-10-CM | POA: Insufficient documentation

## 2011-05-20 DIAGNOSIS — N993 Prolapse of vaginal vault after hysterectomy: Secondary | ICD-10-CM

## 2011-05-20 DIAGNOSIS — IMO0002 Reserved for concepts with insufficient information to code with codable children: Secondary | ICD-10-CM | POA: Insufficient documentation

## 2011-05-20 DIAGNOSIS — N760 Acute vaginitis: Secondary | ICD-10-CM | POA: Diagnosis not present

## 2011-05-20 DIAGNOSIS — N879 Dysplasia of cervix uteri, unspecified: Secondary | ICD-10-CM | POA: Insufficient documentation

## 2011-05-20 DIAGNOSIS — R32 Unspecified urinary incontinence: Secondary | ICD-10-CM | POA: Insufficient documentation

## 2011-05-20 DIAGNOSIS — N762 Acute vulvitis: Secondary | ICD-10-CM

## 2011-05-20 DIAGNOSIS — M810 Age-related osteoporosis without current pathological fracture: Secondary | ICD-10-CM | POA: Diagnosis not present

## 2011-05-20 DIAGNOSIS — N816 Rectocele: Secondary | ICD-10-CM | POA: Insufficient documentation

## 2011-05-20 DIAGNOSIS — R351 Nocturia: Secondary | ICD-10-CM

## 2011-05-20 NOTE — Progress Notes (Signed)
Patient came back to see me today for further followup. She gets vulvar irritation which she treats with a cortisone cream( OTC) with good results. She is having no vaginal bleeding. She is aware of both her cystocele and vaginal vault prolapse but is not interested in surgery due to her age and the risks. In discussing with her today this seems to be a small concern. She does have osteoporosis. She has not had a fracture. She took medication for short period of time. We have been unable to convince her to stay on it due to her concern about ONJ. We have tried to emphasize that a hip fracture is a worse risk. She is having no pelvic pain. She is due for her mammogram. She does her lab through PCP. She has terrible detrussor instability with both nocturia x6 or 7 times a night and urgency. She is having no incontinence. She is having no dysuria. We have tried her on a variety of medications for her DI without success. Since I last saw her she went back to Dr. Celso Amy who tried other drugs without success. He also did not feel she was a surgical risk.  ROS: 12 system review done. Pertinent positives above. Other positives include acid reflux, hypertension, leaky heart valve, and a previous CVA.  HEENT: Within normal limits. Kennon Portela present. Neck: No masses. Supraclavicular lymph nodes: Not enlarged. Breasts: Examined in both sitting and lying position. Symmetrical without skin changes or masses. Abdomen: Soft no masses guarding or rebound. No hernias. Pelvic: External within normal limits. BUS within normal limits. Vaginal examination shows second-degree vault prolapse with second-degree cystocele. Patient also has atrophic vaginal changes with a shortened vagina. Cervix and uterus absent. Adnexa within normal limits. Rectovaginal confirmatory. Extremities within normal limits.  Assessment: #1. Vaginal vault prolapse #2. Cystocele #3. Osteoporosis #4. Detrussor instability  Plan: We discussed all  the above issues in detail. Patient refused medication for her osteoporosis. Discussed with her [facility]. For the moment she is not interested. If she is she will call me and we will call her urologist to see if he feels it is an option. She will go back and get a mammogram.

## 2011-05-30 DIAGNOSIS — M159 Polyosteoarthritis, unspecified: Secondary | ICD-10-CM | POA: Diagnosis not present

## 2011-05-30 DIAGNOSIS — Z0000 Encounter for general adult medical examination without abnormal findings: Secondary | ICD-10-CM | POA: Diagnosis not present

## 2011-05-30 DIAGNOSIS — I10 Essential (primary) hypertension: Secondary | ICD-10-CM | POA: Diagnosis not present

## 2011-05-30 DIAGNOSIS — R5381 Other malaise: Secondary | ICD-10-CM | POA: Diagnosis not present

## 2011-05-30 DIAGNOSIS — A499 Bacterial infection, unspecified: Secondary | ICD-10-CM | POA: Diagnosis not present

## 2011-05-30 DIAGNOSIS — J45909 Unspecified asthma, uncomplicated: Secondary | ICD-10-CM | POA: Diagnosis not present

## 2011-05-30 DIAGNOSIS — N390 Urinary tract infection, site not specified: Secondary | ICD-10-CM | POA: Diagnosis not present

## 2011-06-06 DIAGNOSIS — R5383 Other fatigue: Secondary | ICD-10-CM | POA: Diagnosis not present

## 2011-06-06 DIAGNOSIS — K5730 Diverticulosis of large intestine without perforation or abscess without bleeding: Secondary | ICD-10-CM | POA: Diagnosis not present

## 2011-06-06 DIAGNOSIS — Z23 Encounter for immunization: Secondary | ICD-10-CM | POA: Diagnosis not present

## 2011-06-06 DIAGNOSIS — H908 Mixed conductive and sensorineural hearing loss, unspecified: Secondary | ICD-10-CM | POA: Diagnosis not present

## 2011-06-06 DIAGNOSIS — I10 Essential (primary) hypertension: Secondary | ICD-10-CM | POA: Diagnosis not present

## 2011-06-06 DIAGNOSIS — J45909 Unspecified asthma, uncomplicated: Secondary | ICD-10-CM | POA: Diagnosis not present

## 2011-06-12 DIAGNOSIS — H6120 Impacted cerumen, unspecified ear: Secondary | ICD-10-CM | POA: Diagnosis not present

## 2011-07-14 DIAGNOSIS — L821 Other seborrheic keratosis: Secondary | ICD-10-CM | POA: Diagnosis not present

## 2011-07-14 DIAGNOSIS — L570 Actinic keratosis: Secondary | ICD-10-CM | POA: Diagnosis not present

## 2011-07-24 DIAGNOSIS — M21619 Bunion of unspecified foot: Secondary | ICD-10-CM | POA: Diagnosis not present

## 2011-07-24 DIAGNOSIS — M25579 Pain in unspecified ankle and joints of unspecified foot: Secondary | ICD-10-CM | POA: Diagnosis not present

## 2011-07-24 DIAGNOSIS — B351 Tinea unguium: Secondary | ICD-10-CM | POA: Diagnosis not present

## 2011-10-14 DIAGNOSIS — B351 Tinea unguium: Secondary | ICD-10-CM | POA: Diagnosis not present

## 2011-10-14 DIAGNOSIS — M79609 Pain in unspecified limb: Secondary | ICD-10-CM | POA: Diagnosis not present

## 2011-11-18 DIAGNOSIS — H04129 Dry eye syndrome of unspecified lacrimal gland: Secondary | ICD-10-CM | POA: Diagnosis not present

## 2011-11-18 DIAGNOSIS — H26499 Other secondary cataract, unspecified eye: Secondary | ICD-10-CM | POA: Diagnosis not present

## 2011-12-12 DIAGNOSIS — K5730 Diverticulosis of large intestine without perforation or abscess without bleeding: Secondary | ICD-10-CM | POA: Diagnosis not present

## 2011-12-12 DIAGNOSIS — N390 Urinary tract infection, site not specified: Secondary | ICD-10-CM | POA: Diagnosis not present

## 2011-12-12 DIAGNOSIS — I10 Essential (primary) hypertension: Secondary | ICD-10-CM | POA: Diagnosis not present

## 2011-12-12 DIAGNOSIS — J45909 Unspecified asthma, uncomplicated: Secondary | ICD-10-CM | POA: Diagnosis not present

## 2011-12-15 DIAGNOSIS — N390 Urinary tract infection, site not specified: Secondary | ICD-10-CM | POA: Diagnosis not present

## 2011-12-19 DIAGNOSIS — M159 Polyosteoarthritis, unspecified: Secondary | ICD-10-CM | POA: Diagnosis not present

## 2011-12-19 DIAGNOSIS — N390 Urinary tract infection, site not specified: Secondary | ICD-10-CM | POA: Diagnosis not present

## 2011-12-19 DIAGNOSIS — J45909 Unspecified asthma, uncomplicated: Secondary | ICD-10-CM | POA: Diagnosis not present

## 2011-12-19 DIAGNOSIS — Z23 Encounter for immunization: Secondary | ICD-10-CM | POA: Diagnosis not present

## 2011-12-19 DIAGNOSIS — I10 Essential (primary) hypertension: Secondary | ICD-10-CM | POA: Diagnosis not present

## 2011-12-24 DIAGNOSIS — H04129 Dry eye syndrome of unspecified lacrimal gland: Secondary | ICD-10-CM | POA: Diagnosis not present

## 2011-12-24 DIAGNOSIS — H26499 Other secondary cataract, unspecified eye: Secondary | ICD-10-CM | POA: Diagnosis not present

## 2012-01-14 DIAGNOSIS — C44621 Squamous cell carcinoma of skin of unspecified upper limb, including shoulder: Secondary | ICD-10-CM | POA: Diagnosis not present

## 2012-01-14 DIAGNOSIS — L821 Other seborrheic keratosis: Secondary | ICD-10-CM | POA: Diagnosis not present

## 2012-01-14 DIAGNOSIS — D485 Neoplasm of uncertain behavior of skin: Secondary | ICD-10-CM | POA: Diagnosis not present

## 2012-01-14 DIAGNOSIS — Z85828 Personal history of other malignant neoplasm of skin: Secondary | ICD-10-CM | POA: Diagnosis not present

## 2012-02-02 DIAGNOSIS — C44621 Squamous cell carcinoma of skin of unspecified upper limb, including shoulder: Secondary | ICD-10-CM | POA: Diagnosis not present

## 2012-04-30 ENCOUNTER — Other Ambulatory Visit: Payer: Self-pay | Admitting: Dermatology

## 2012-04-30 DIAGNOSIS — L910 Hypertrophic scar: Secondary | ICD-10-CM | POA: Diagnosis not present

## 2012-04-30 DIAGNOSIS — D485 Neoplasm of uncertain behavior of skin: Secondary | ICD-10-CM | POA: Diagnosis not present

## 2012-06-03 DIAGNOSIS — N390 Urinary tract infection, site not specified: Secondary | ICD-10-CM | POA: Diagnosis not present

## 2012-06-03 DIAGNOSIS — I10 Essential (primary) hypertension: Secondary | ICD-10-CM | POA: Diagnosis not present

## 2012-06-03 DIAGNOSIS — M159 Polyosteoarthritis, unspecified: Secondary | ICD-10-CM | POA: Diagnosis not present

## 2012-06-03 DIAGNOSIS — J45909 Unspecified asthma, uncomplicated: Secondary | ICD-10-CM | POA: Diagnosis not present

## 2012-06-03 DIAGNOSIS — Z1331 Encounter for screening for depression: Secondary | ICD-10-CM | POA: Diagnosis not present

## 2012-06-03 DIAGNOSIS — Z0000 Encounter for general adult medical examination without abnormal findings: Secondary | ICD-10-CM | POA: Diagnosis not present

## 2012-06-10 DIAGNOSIS — K5730 Diverticulosis of large intestine without perforation or abscess without bleeding: Secondary | ICD-10-CM | POA: Diagnosis not present

## 2012-06-10 DIAGNOSIS — M159 Polyosteoarthritis, unspecified: Secondary | ICD-10-CM | POA: Diagnosis not present

## 2012-06-10 DIAGNOSIS — I10 Essential (primary) hypertension: Secondary | ICD-10-CM | POA: Diagnosis not present

## 2012-06-10 DIAGNOSIS — M810 Age-related osteoporosis without current pathological fracture: Secondary | ICD-10-CM | POA: Diagnosis not present

## 2012-06-10 DIAGNOSIS — H908 Mixed conductive and sensorineural hearing loss, unspecified: Secondary | ICD-10-CM | POA: Diagnosis not present

## 2012-09-08 DIAGNOSIS — H01009 Unspecified blepharitis unspecified eye, unspecified eyelid: Secondary | ICD-10-CM | POA: Diagnosis not present

## 2012-09-08 DIAGNOSIS — H16149 Punctate keratitis, unspecified eye: Secondary | ICD-10-CM | POA: Diagnosis not present

## 2012-09-08 DIAGNOSIS — H04129 Dry eye syndrome of unspecified lacrimal gland: Secondary | ICD-10-CM | POA: Diagnosis not present

## 2012-09-22 DIAGNOSIS — H04129 Dry eye syndrome of unspecified lacrimal gland: Secondary | ICD-10-CM | POA: Diagnosis not present

## 2012-09-22 DIAGNOSIS — H16149 Punctate keratitis, unspecified eye: Secondary | ICD-10-CM | POA: Diagnosis not present

## 2012-09-22 DIAGNOSIS — H26499 Other secondary cataract, unspecified eye: Secondary | ICD-10-CM | POA: Diagnosis not present

## 2012-09-22 DIAGNOSIS — H01009 Unspecified blepharitis unspecified eye, unspecified eyelid: Secondary | ICD-10-CM | POA: Diagnosis not present

## 2012-10-18 DIAGNOSIS — C44621 Squamous cell carcinoma of skin of unspecified upper limb, including shoulder: Secondary | ICD-10-CM | POA: Diagnosis not present

## 2012-12-15 DIAGNOSIS — B351 Tinea unguium: Secondary | ICD-10-CM | POA: Diagnosis not present

## 2012-12-16 DIAGNOSIS — M159 Polyosteoarthritis, unspecified: Secondary | ICD-10-CM | POA: Diagnosis not present

## 2012-12-16 DIAGNOSIS — M810 Age-related osteoporosis without current pathological fracture: Secondary | ICD-10-CM | POA: Diagnosis not present

## 2012-12-16 DIAGNOSIS — I10 Essential (primary) hypertension: Secondary | ICD-10-CM | POA: Diagnosis not present

## 2012-12-16 DIAGNOSIS — K5730 Diverticulosis of large intestine without perforation or abscess without bleeding: Secondary | ICD-10-CM | POA: Diagnosis not present

## 2012-12-23 DIAGNOSIS — H5710 Ocular pain, unspecified eye: Secondary | ICD-10-CM | POA: Diagnosis not present

## 2012-12-23 DIAGNOSIS — I10 Essential (primary) hypertension: Secondary | ICD-10-CM | POA: Diagnosis not present

## 2012-12-23 DIAGNOSIS — M79609 Pain in unspecified limb: Secondary | ICD-10-CM | POA: Diagnosis not present

## 2012-12-23 DIAGNOSIS — M810 Age-related osteoporosis without current pathological fracture: Secondary | ICD-10-CM | POA: Diagnosis not present

## 2013-01-12 DIAGNOSIS — Z23 Encounter for immunization: Secondary | ICD-10-CM | POA: Diagnosis not present

## 2013-01-17 DIAGNOSIS — N390 Urinary tract infection, site not specified: Secondary | ICD-10-CM | POA: Diagnosis not present

## 2013-03-09 ENCOUNTER — Ambulatory Visit: Payer: Self-pay | Admitting: Podiatrist

## 2013-06-17 DIAGNOSIS — Z0000 Encounter for general adult medical examination without abnormal findings: Secondary | ICD-10-CM | POA: Diagnosis not present

## 2013-06-17 DIAGNOSIS — Z1331 Encounter for screening for depression: Secondary | ICD-10-CM | POA: Diagnosis not present

## 2013-06-17 DIAGNOSIS — I10 Essential (primary) hypertension: Secondary | ICD-10-CM | POA: Diagnosis not present

## 2013-06-17 DIAGNOSIS — R5381 Other malaise: Secondary | ICD-10-CM | POA: Diagnosis not present

## 2013-06-27 DIAGNOSIS — K2100 Gastro-esophageal reflux disease with esophagitis, without bleeding: Secondary | ICD-10-CM | POA: Diagnosis not present

## 2013-06-27 DIAGNOSIS — M159 Polyosteoarthritis, unspecified: Secondary | ICD-10-CM | POA: Diagnosis not present

## 2013-06-27 DIAGNOSIS — K5730 Diverticulosis of large intestine without perforation or abscess without bleeding: Secondary | ICD-10-CM | POA: Diagnosis not present

## 2013-06-27 DIAGNOSIS — I10 Essential (primary) hypertension: Secondary | ICD-10-CM | POA: Diagnosis not present

## 2013-07-27 DIAGNOSIS — H04129 Dry eye syndrome of unspecified lacrimal gland: Secondary | ICD-10-CM | POA: Diagnosis not present

## 2013-07-27 DIAGNOSIS — H26499 Other secondary cataract, unspecified eye: Secondary | ICD-10-CM | POA: Diagnosis not present

## 2013-07-27 DIAGNOSIS — H43399 Other vitreous opacities, unspecified eye: Secondary | ICD-10-CM | POA: Diagnosis not present

## 2013-08-23 DIAGNOSIS — N390 Urinary tract infection, site not specified: Secondary | ICD-10-CM | POA: Diagnosis not present

## 2013-08-25 DIAGNOSIS — L570 Actinic keratosis: Secondary | ICD-10-CM | POA: Diagnosis not present

## 2013-08-25 DIAGNOSIS — L905 Scar conditions and fibrosis of skin: Secondary | ICD-10-CM | POA: Diagnosis not present

## 2013-11-17 ENCOUNTER — Encounter: Payer: Self-pay | Admitting: Podiatrist

## 2013-11-17 ENCOUNTER — Ambulatory Visit (INDEPENDENT_AMBULATORY_CARE_PROVIDER_SITE_OTHER): Payer: Medicare Other | Admitting: Podiatrist

## 2013-11-17 VITALS — BP 122/86 | HR 72 | Resp 12

## 2013-11-17 DIAGNOSIS — M79609 Pain in unspecified limb: Secondary | ICD-10-CM | POA: Diagnosis not present

## 2013-11-17 DIAGNOSIS — B351 Tinea unguium: Secondary | ICD-10-CM

## 2013-11-17 NOTE — Progress Notes (Signed)
   Subjective:    Patient ID: [name], female    DOB: Jan 15, 1920, 80 y.o.   MRN: [id number]  HPI ''TOENAIL TRIM AND CHECK LT FOOT TOENAIL HAVE DISCOLORATION.''    Review of Systems  Musculoskeletal: Positive for gait problem.  All other systems reviewed and are negative.      Objective:   Physical Exam Patient is awake, alert, and oriented x 3.  In no acute distress.  Vascular status is intact with palpable pedal pulses at 1/4 DP and PT bilateral and capillary refill time within normal limits. Neurological sensation is also intact bilaterally via Semmes Weinstein monofilament at 5/5 sites. Light touch, vibratory sensation, Achilles tendon reflex is intact. Dermatological exam reveals skin color, turger and texture as normal. No open lesions present.  Musculature intact with dorsiflexion, plantarflexion, inversion, eversion. Toenails elongated, discolored, distrophic discolred.      Assessment & Plan:  symptomatic mycotic toenails  Plan:  Debridement of toenails was accomplished without complication.  She will return in 3 months or as needed.

## 2013-11-23 ENCOUNTER — Encounter: Payer: Self-pay | Admitting: Women's Health

## 2013-11-23 ENCOUNTER — Ambulatory Visit (INDEPENDENT_AMBULATORY_CARE_PROVIDER_SITE_OTHER): Payer: Medicare Other | Admitting: Women's Health

## 2013-11-23 VITALS — BP 130/64 | Ht <= 58 in | Wt 100.0 lb

## 2013-11-23 DIAGNOSIS — R1032 Left lower quadrant pain: Secondary | ICD-10-CM | POA: Diagnosis not present

## 2013-11-23 LAB — URINALYSIS W MICROSCOPIC + REFLEX CULTURE
BILIRUBIN URINE: NEGATIVE
CASTS: NONE SEEN
Crystals: NONE SEEN
GLUCOSE, UA: NEGATIVE mg/dL
Ketones, ur: NEGATIVE mg/dL
Nitrite: NEGATIVE
PH: 6 (ref 5.0–8.0)
Protein, ur: NEGATIVE mg/dL
Specific Gravity, Urine: <1.005 — ABNORMAL LOW (ref 1.005–1.030)
Urobilinogen, UA: 0.2 mg/dL (ref 0.0–1.0)

## 2013-11-23 NOTE — Progress Notes (Signed)
[name] [birth year] [id number]    History:    Presents for breast and pelvic exam with complaint of left lower quadrant pain that she has had off-and-on for many years. Has IBS, intermittent bouts of diarrhea followed with constipation. Diverticulosis, arthritis, hypertension managed by primary care. History of normal mammograms, last Pap 2010 normal. 1991 TAH with A&P repair. Osteoporosis but declines treatment. Current on immunizations.  Past medical history, past surgical history, family history and social history were all reviewed and documented in the EPIC chart. Lives at [name], divorced for many years, has 2 daughters.  ROS:  A  12 point ROS was performed and pertinent positives and negatives are included.  Exam:  Filed Vitals:   11/23/13 1444  BP: 130/64    General appearance:  Walksl with a walker, kyphosis, alert and oriented slightly hard of hearing. Thyroid:  Symmetrical, normal in size, without palpable masses or nodularity. Respiratory  Auscultation:  Clear without wheezing or rhonchi Cardiovascular  Auscultation:  Regular rate, without rubs, murmurs or gallops  Edema/varicosities:  Not grossly evident Abdominal  Soft,nontender, without masses, guarding or rebound.  Liver/spleen:  No organomegaly noted  Hernia:  None appreciated  Skin  Inspection:  Grossly normal   Breasts: Examined lying and sitting.     Right: Without masses, retractions, discharge or axillary adenopathy.     Left: Without masses, retractions, discharge or axillary adenopathy. Gentitourinary   Inguinal/mons:  Normal without inguinal adenopathy  External genitalia:  Normal  BUS/Urethra/Skene's glands:  Normal  Vagina:  Atrophic with mild asymptomatic cystocele  Cervix -: uterus absent  Adnexa/parametria:     Rt: Without masses or tenderness.   Lt: Without masses or tenderness.  Anus and perineum: Normal  Digital rectal exam: Normal sphincter tone without palpated  masses or tenderness  Assessment/Plan:  80 y.o.[name] G6P2  for breast and pelvic exam and left lower quadrant pain.  Recurring left lower quadrant pain for years IBS/diverticulosis Osteoporosis declines treatment Hypertension-primary care manages labs and meds Skin cancer-dermatologist manages  UA: Trace blood, small leukocytes, 7-10 WBCs, 0 - 2 RBCs, rare bacteria.  Plan: Urine culture pending. Reviewed most likely cause for left lower quadrant discomfort is GI related, declines followup with Dr. Amedeo [name] gastroenterologist. Home safety and fall prevention discussed. Paps normal 2000 10 repeat new screening guidelines.   Plan: UA   Note: This dictation was prepared with Dragon/digital dictation.  Any transcriptional errors that result are unintentional. [name] [name], 4:00 PM 11/23/2013

## 2013-11-27 LAB — URINE CULTURE

## 2013-12-08 DIAGNOSIS — D239 Other benign neoplasm of skin, unspecified: Secondary | ICD-10-CM | POA: Diagnosis not present

## 2013-12-08 DIAGNOSIS — L570 Actinic keratosis: Secondary | ICD-10-CM | POA: Diagnosis not present

## 2013-12-08 DIAGNOSIS — Z85828 Personal history of other malignant neoplasm of skin: Secondary | ICD-10-CM | POA: Diagnosis not present

## 2013-12-08 DIAGNOSIS — L821 Other seborrheic keratosis: Secondary | ICD-10-CM | POA: Diagnosis not present

## 2013-12-30 DIAGNOSIS — Z23 Encounter for immunization: Secondary | ICD-10-CM | POA: Diagnosis not present

## 2014-01-02 DIAGNOSIS — I10 Essential (primary) hypertension: Secondary | ICD-10-CM | POA: Diagnosis not present

## 2014-01-09 DIAGNOSIS — M159 Polyosteoarthritis, unspecified: Secondary | ICD-10-CM | POA: Diagnosis not present

## 2014-01-09 DIAGNOSIS — I10 Essential (primary) hypertension: Secondary | ICD-10-CM | POA: Diagnosis not present

## 2014-01-09 DIAGNOSIS — J4520 Mild intermittent asthma, uncomplicated: Secondary | ICD-10-CM | POA: Diagnosis not present

## 2014-01-23 ENCOUNTER — Encounter: Payer: Self-pay | Admitting: Women's Health

## 2014-03-08 DIAGNOSIS — L570 Actinic keratosis: Secondary | ICD-10-CM | POA: Diagnosis not present

## 2014-03-08 DIAGNOSIS — L821 Other seborrheic keratosis: Secondary | ICD-10-CM | POA: Diagnosis not present

## 2014-03-08 DIAGNOSIS — L910 Hypertrophic scar: Secondary | ICD-10-CM | POA: Diagnosis not present

## 2014-03-08 DIAGNOSIS — D2271 Melanocytic nevi of right lower limb, including hip: Secondary | ICD-10-CM | POA: Diagnosis not present

## 2014-04-11 DIAGNOSIS — N390 Urinary tract infection, site not specified: Secondary | ICD-10-CM | POA: Diagnosis not present

## 2014-04-11 DIAGNOSIS — I10 Essential (primary) hypertension: Secondary | ICD-10-CM | POA: Diagnosis not present

## 2014-04-11 DIAGNOSIS — R300 Dysuria: Secondary | ICD-10-CM | POA: Diagnosis not present

## 2014-06-07 DIAGNOSIS — Z85828 Personal history of other malignant neoplasm of skin: Secondary | ICD-10-CM | POA: Diagnosis not present

## 2014-06-07 DIAGNOSIS — L570 Actinic keratosis: Secondary | ICD-10-CM | POA: Diagnosis not present

## 2014-06-07 DIAGNOSIS — D2271 Melanocytic nevi of right lower limb, including hip: Secondary | ICD-10-CM | POA: Diagnosis not present

## 2014-06-07 DIAGNOSIS — B354 Tinea corporis: Secondary | ICD-10-CM | POA: Diagnosis not present

## 2014-06-07 DIAGNOSIS — D225 Melanocytic nevi of trunk: Secondary | ICD-10-CM | POA: Diagnosis not present

## 2014-06-07 DIAGNOSIS — L821 Other seborrheic keratosis: Secondary | ICD-10-CM | POA: Diagnosis not present

## 2014-07-17 DIAGNOSIS — M159 Polyosteoarthritis, unspecified: Secondary | ICD-10-CM | POA: Diagnosis not present

## 2014-07-17 DIAGNOSIS — Z0000 Encounter for general adult medical examination without abnormal findings: Secondary | ICD-10-CM | POA: Diagnosis not present

## 2014-07-17 DIAGNOSIS — Z1389 Encounter for screening for other disorder: Secondary | ICD-10-CM | POA: Diagnosis not present

## 2014-07-17 DIAGNOSIS — I10 Essential (primary) hypertension: Secondary | ICD-10-CM | POA: Diagnosis not present

## 2014-07-24 DIAGNOSIS — J4520 Mild intermittent asthma, uncomplicated: Secondary | ICD-10-CM | POA: Diagnosis not present

## 2014-07-24 DIAGNOSIS — M159 Polyosteoarthritis, unspecified: Secondary | ICD-10-CM | POA: Diagnosis not present

## 2014-07-24 DIAGNOSIS — I10 Essential (primary) hypertension: Secondary | ICD-10-CM | POA: Diagnosis not present

## 2014-07-24 DIAGNOSIS — K219 Gastro-esophageal reflux disease without esophagitis: Secondary | ICD-10-CM | POA: Diagnosis not present

## 2014-08-01 ENCOUNTER — Telehealth: Payer: Self-pay | Admitting: Gastroenterology

## 2014-08-01 NOTE — Telephone Encounter (Signed)
Agree. Peptobismol will turn stools black

## 2014-08-01 NOTE — Telephone Encounter (Signed)
Patient reports a history of IBS with diarrhea and constipation. She has had diarrhea for the past few days. Yesterday she noted 2 of the stools were black. She noticed this again today. She has taken Pepto-Bismol in the past 48 hours but does not feel it is related. She walks with a walker and does not note any new dizzyness. Appointment scheduled for the patient on 08/17/14 at 2:15 pm. She will call back if she acutely worsens or go to the ER.

## 2014-08-10 ENCOUNTER — Ambulatory Visit (INDEPENDENT_AMBULATORY_CARE_PROVIDER_SITE_OTHER): Payer: Medicare Other | Admitting: Gynecology

## 2014-08-10 ENCOUNTER — Encounter: Payer: Self-pay | Admitting: Gynecology

## 2014-08-10 VITALS — BP 124/78

## 2014-08-10 DIAGNOSIS — N762 Acute vulvitis: Secondary | ICD-10-CM

## 2014-08-10 MED ORDER — BETAMETHASONE DIPROPIONATE 0.05 % EX CREA: TOPICAL_CREAM | Freq: Two times a day (BID) | CUTANEOUS | Status: DC

## 2014-08-10 NOTE — Progress Notes (Signed)
[name] [birth year] [id number]        81 y.o.  [id number] Presents with months of on and off over irritation. Will come and last for several days and then resolve. She uses 1% hydrocortisone cream which seems to help it.  No discharge or odor.  Does have some urinary incontinence for which she wears a pad all the time.  Past medical history,surgical history, problem list, medications, allergies, family history and social history were all reviewed and documented in the EPIC chart.  Directed ROS with pertinent positives and negatives documented in the history of present illness/assessment and plan.  Exam: Kim assistant Filed Vitals:   08/10/14 1538  BP: 124/78   General appearance:  Normal Abdomen soft without masses or guarding. Pelvic external BUS vagina with atrophic changes. Mild irritative changes lower bilateral labia majora and perineal body. No lesions or open skin.  First to second degree cystocele. First degree rectocele. Vagina somewhat foreshortened. No discharge. Bimanual without masses or tenderness.  Assessment/Plan:  81 y.o. [id number] With mild vulvitis which I think is probably secondary to wearing her pads. Appears a mild steroid cream helps so we will try Diprolene 0.05% cream intermittently as needed for symptoms. Assuming this helps and will monitor. If it persists or worsens then she'll represent for further evaluation.    Anastasio Auerbach MD, 3:57 PM 08/10/2014

## 2014-08-10 NOTE — Patient Instructions (Signed)
Apply the steroid cream up to twice daily as needed for irritation. Follow up if her symptoms persist or worsen

## 2014-08-17 ENCOUNTER — Ambulatory Visit: Payer: PRIVATE HEALTH INSURANCE | Admitting: Gastroenterology

## 2014-09-21 DIAGNOSIS — I10 Essential (primary) hypertension: Secondary | ICD-10-CM | POA: Diagnosis not present

## 2014-09-21 DIAGNOSIS — N390 Urinary tract infection, site not specified: Secondary | ICD-10-CM | POA: Diagnosis not present

## 2014-09-28 DIAGNOSIS — K219 Gastro-esophageal reflux disease without esophagitis: Secondary | ICD-10-CM | POA: Diagnosis not present

## 2014-09-28 DIAGNOSIS — I10 Essential (primary) hypertension: Secondary | ICD-10-CM | POA: Diagnosis not present

## 2014-09-28 DIAGNOSIS — N390 Urinary tract infection, site not specified: Secondary | ICD-10-CM | POA: Diagnosis not present

## 2014-09-28 DIAGNOSIS — H6122 Impacted cerumen, left ear: Secondary | ICD-10-CM | POA: Diagnosis not present

## 2014-11-30 DIAGNOSIS — A09 Infectious gastroenteritis and colitis, unspecified: Secondary | ICD-10-CM | POA: Diagnosis not present

## 2014-11-30 DIAGNOSIS — R5383 Other fatigue: Secondary | ICD-10-CM | POA: Diagnosis not present

## 2014-11-30 DIAGNOSIS — R195 Other fecal abnormalities: Secondary | ICD-10-CM | POA: Diagnosis not present

## 2014-12-12 DIAGNOSIS — L820 Inflamed seborrheic keratosis: Secondary | ICD-10-CM | POA: Diagnosis not present

## 2014-12-12 DIAGNOSIS — L821 Other seborrheic keratosis: Secondary | ICD-10-CM | POA: Diagnosis not present

## 2014-12-12 DIAGNOSIS — Z85828 Personal history of other malignant neoplasm of skin: Secondary | ICD-10-CM | POA: Diagnosis not present

## 2014-12-12 DIAGNOSIS — D2271 Melanocytic nevi of right lower limb, including hip: Secondary | ICD-10-CM | POA: Diagnosis not present

## 2014-12-12 DIAGNOSIS — L570 Actinic keratosis: Secondary | ICD-10-CM | POA: Diagnosis not present

## 2014-12-12 DIAGNOSIS — D225 Melanocytic nevi of trunk: Secondary | ICD-10-CM | POA: Diagnosis not present

## 2015-01-01 DIAGNOSIS — Z23 Encounter for immunization: Secondary | ICD-10-CM | POA: Diagnosis not present

## 2015-01-16 ENCOUNTER — Emergency Department (HOSPITAL_COMMUNITY)
Admission: EM | Admit: 2015-01-16 | Discharge: 2015-01-16 | Disposition: A | Discharge status: Home / Self Care | Payer: Medicare Other | Attending: Emergency Medicine | Admitting: Emergency Medicine

## 2015-01-16 ENCOUNTER — Encounter (HOSPITAL_COMMUNITY): Payer: Self-pay | Admitting: Emergency Medicine

## 2015-01-16 ENCOUNTER — Emergency Department (HOSPITAL_COMMUNITY): Payer: Medicare Other

## 2015-01-16 DIAGNOSIS — Z7982 Long term (current) use of aspirin: Secondary | ICD-10-CM | POA: Diagnosis not present

## 2015-01-16 DIAGNOSIS — Z8673 Personal history of transient ischemic attack (TIA), and cerebral infarction without residual deficits: Secondary | ICD-10-CM | POA: Insufficient documentation

## 2015-01-16 DIAGNOSIS — K219 Gastro-esophageal reflux disease without esophagitis: Secondary | ICD-10-CM | POA: Insufficient documentation

## 2015-01-16 DIAGNOSIS — Z7952 Long term (current) use of systemic steroids: Secondary | ICD-10-CM | POA: Diagnosis not present

## 2015-01-16 DIAGNOSIS — Z880 Allergy status to penicillin: Secondary | ICD-10-CM | POA: Insufficient documentation

## 2015-01-16 DIAGNOSIS — M1990 Unspecified osteoarthritis, unspecified site: Secondary | ICD-10-CM | POA: Diagnosis not present

## 2015-01-16 DIAGNOSIS — R030 Elevated blood-pressure reading, without diagnosis of hypertension: Secondary | ICD-10-CM | POA: Diagnosis not present

## 2015-01-16 DIAGNOSIS — Z87440 Personal history of urinary (tract) infections: Secondary | ICD-10-CM | POA: Insufficient documentation

## 2015-01-16 DIAGNOSIS — M25532 Pain in left wrist: Secondary | ICD-10-CM | POA: Diagnosis not present

## 2015-01-16 DIAGNOSIS — Z79899 Other long term (current) drug therapy: Secondary | ICD-10-CM | POA: Insufficient documentation

## 2015-01-16 DIAGNOSIS — Z8742 Personal history of other diseases of the female genital tract: Secondary | ICD-10-CM | POA: Insufficient documentation

## 2015-01-16 DIAGNOSIS — I10 Essential (primary) hypertension: Secondary | ICD-10-CM | POA: Insufficient documentation

## 2015-01-16 DIAGNOSIS — M11232 Other chondrocalcinosis, left wrist: Secondary | ICD-10-CM

## 2015-01-16 DIAGNOSIS — R011 Cardiac murmur, unspecified: Secondary | ICD-10-CM | POA: Insufficient documentation

## 2015-01-16 DIAGNOSIS — R609 Edema, unspecified: Secondary | ICD-10-CM | POA: Diagnosis not present

## 2015-01-16 DIAGNOSIS — M129 Arthropathy, unspecified: Secondary | ICD-10-CM | POA: Diagnosis not present

## 2015-01-16 DIAGNOSIS — M109 Gout, unspecified: Secondary | ICD-10-CM | POA: Insufficient documentation

## 2015-01-16 DIAGNOSIS — M25432 Effusion, left wrist: Secondary | ICD-10-CM | POA: Diagnosis not present

## 2015-01-16 LAB — CBC WITH DIFFERENTIAL/PLATELET
Basophils Absolute: 0 10*3/uL (ref 0.0–0.1)
Basophils Relative: 0 %
EOS PCT: 0 %
Eosinophils Absolute: 0 10*3/uL (ref 0.0–0.7)
HCT: 36.8 % (ref 36.0–46.0)
Hemoglobin: 12.8 g/dL (ref 12.0–15.0)
LYMPHS ABS: 0.9 10*3/uL (ref 0.7–4.0)
LYMPHS PCT: 6 %
MCH: 31.2 pg (ref 26.0–34.0)
MCHC: 34.8 g/dL (ref 30.0–36.0)
MCV: 89.8 fL (ref 78.0–100.0)
MONO ABS: 0.9 10*3/uL (ref 0.1–1.0)
Monocytes Relative: 6 %
Neutro Abs: 12.5 10*3/uL — ABNORMAL HIGH (ref 1.7–7.7)
Neutrophils Relative %: 88 %
PLATELETS: 276 10*3/uL (ref 150–400)
RBC: 4.1 MIL/uL (ref 3.87–5.11)
RDW: 13.5 % (ref 11.5–15.5)
WBC: 14.3 10*3/uL — ABNORMAL HIGH (ref 4.0–10.5)

## 2015-01-16 LAB — I-STAT CHEM 8, ED
BUN: 12 mg/dL (ref 6–20)
CALCIUM ION: 1.09 mmol/L — AB (ref 1.13–1.30)
CHLORIDE: 95 mmol/L — AB (ref 101–111)
Creatinine, Ser: 0.8 mg/dL (ref 0.44–1.00)
GLUCOSE: 112 mg/dL — AB (ref 65–99)
HCT: 43 % (ref 36.0–46.0)
Hemoglobin: 14.6 g/dL (ref 12.0–15.0)
Potassium: 3.7 mmol/L (ref 3.5–5.1)
Sodium: 133 mmol/L — ABNORMAL LOW (ref 135–145)
TCO2: 25 mmol/L (ref 0–100)

## 2015-01-16 LAB — URINALYSIS, ROUTINE W REFLEX MICROSCOPIC
Bilirubin Urine: NEGATIVE
GLUCOSE, UA: NEGATIVE mg/dL
HGB URINE DIPSTICK: NEGATIVE
Ketones, ur: NEGATIVE mg/dL
Leukocytes, UA: NEGATIVE
Nitrite: NEGATIVE
PH: 7 (ref 5.0–8.0)
PROTEIN: NEGATIVE mg/dL
Specific Gravity, Urine: 1.009 (ref 1.005–1.030)
Urobilinogen, UA: 0.2 mg/dL (ref 0.0–1.0)

## 2015-01-16 MED ORDER — HYDROCODONE-ACETAMINOPHEN 5-325 MG PO TABS
1.0000 | ORAL_TABLET | ORAL | Status: DC | PRN
Start: 2015-01-16 — End: 2015-05-14

## 2015-01-16 MED ORDER — OXYCODONE-ACETAMINOPHEN 5-325 MG PO TABS
1.0000 | ORAL_TABLET | Freq: Once | ORAL | Status: AC
  Administered 2015-01-16: 1 via ORAL
  Filled 2015-01-16: qty 1

## 2015-01-16 MED ORDER — COLCHICINE 0.6 MG PO TABS: 0.6000 mg | ORAL_TABLET | Freq: Every day | ORAL | Status: DC

## 2015-01-16 MED ORDER — COLCHICINE 0.6 MG PO TABS
1.2000 mg | ORAL_TABLET | Freq: Once | ORAL | Status: AC
Start: 2015-01-16 — End: 2015-01-16
  Administered 2015-01-16: 1.2 mg via ORAL
  Filled 2015-01-16: qty 2

## 2015-01-16 NOTE — ED Provider Notes (Addendum)
CSN: [id number]     Arrival date & time 01/16/15  0730 History   First MD Initiated Contact with Patient 01/16/15 [phone]     Chief Complaint  Patient presents with  . Fall     (Consider location/radiation/quality/duration/timing/severity/associated sxs/prior Treatment) Patient is a 81 y.o. female presenting with wrist pain. The history is provided by the patient.  Wrist Pain This is a new problem. The current episode started 3 to 5 hours ago. The problem occurs constantly. The problem has been gradually worsening. Associated symptoms comments: Pain swelling and redness of the left wirst starting last night.  No fever.  No injury.  No prior sx.  Pt states she recently finished a course of Cipro last week after UTI but denies any abdominal pain, vomiting.. The symptoms are aggravated by bending and twisting. Nothing relieves the symptoms. She has tried nothing for the symptoms. The treatment provided no relief.    Past Medical History  Diagnosis Date  . Heart valve disorder     "leaking"  . Hypertension   . Stroke [facility])     mild strokes  . Osteoporosis   . Cervical dysplasia   . Cystocele   . Rectocele   . Acid reflux   . Urinary incontinence    Past Surgical History  Procedure Laterality Date  . Total abdominal hysterectomy  1991    TAH, A/P REPAIR  . Cholecystectomy  2003  . Total hip arthroplasty      X2  . Inguinal hernia repair    . Appendectomy    . Prolapsed intestines    . Surgery for adhesions related to intestines    . Colposcopy    . Bladder suspension     Family History  Problem Relation Age of Onset  . Cancer Mother     COLON  . Hypertension Father   . Heart disease Father   . Heart disease Sister   . Emphysema Brother    Social History  Substance Use Topics  . Smoking status: Never Smoker   . Smokeless tobacco: None  . Alcohol Use: 2.0 oz/week    4 drink(s) per week   OB History    Gravida Para Term Preterm AB TAB SAB Ectopic Multiple Living   6  2 2  4     2      Review of Systems  All other systems reviewed and are negative.     Allergies  Aspirin; Penicillins; and Quinine derivatives  Home Medications   Prior to Admission medications   Medication Sig Start Date End Date Taking? Authorizing Provider  Artificial Tear Solution (BION TEARS) 0.1-0.3 % SOLN Apply to eye.    Historical Provider, MD  Ascorbic Acid (VITAMIN C PO) Take by mouth.    Historical Provider, MD  aspirin 81 MG tablet Take 81 mg by mouth daily. Every third day    Historical Provider, MD  betamethasone dipropionate (DIPROLENE) 0.05 % cream Apply topically 2 (two) times daily. As needed for irritation 08/10/14   Anastasio Auerbach, MD  bisoprolol (ZEBETA) 5 MG tablet Take 5 mg by mouth daily.    Historical Provider, MD  Calcium-Vitamin D-Vitamin K (CALCIUM + D + K) [phone] MG-UNT-MCG TABS Take by mouth. Calcium 1200mg /Vit D 900    Historical Provider, MD  cholecalciferol (VITAMIN D) 1000 UNITS tablet Take 1,000 Units by mouth daily.    Historical Provider, MD  guanFACINE (TENEX) 2 MG tablet Take 2 mg by mouth at bedtime.    Historical  Provider, MD  Hypromellose (GENTEAL OP) Apply to eye.    Historical Provider, MD  LOSARTAN POTASSIUM PO Take by mouth.    Historical Provider, MD  Multiple Vitamins-Minerals (CENTRUM SILVER PO) Take by mouth.    Historical Provider, MD  omeprazole (PRILOSEC) 20 MG capsule Take 20 mg by mouth daily.    Historical Provider, MD   BP 180/102 mmHg  Pulse 70  Temp(Src) 97.5 F (36.4 C) (Oral)  Resp 16  Ht 4\' 8"  (1.422 m)  Wt 100 lb (45.36 kg)  BMI 22.43 kg/m2  SpO2 97% Physical Exam  Constitutional: She is oriented to person, place, and time. She appears well-developed and well-nourished. No distress.  HENT:  Head: Normocephalic and atraumatic.  Mouth/Throat: Oropharynx is clear and moist.  Eyes: Conjunctivae and EOM are normal. Pupils are equal, round, and reactive to light.  Neck: Normal range of motion. Neck supple.    Cardiovascular: Normal rate, regular rhythm and intact distal pulses.   Murmur heard. Pulmonary/Chest: Effort normal and breath sounds normal. No respiratory distress. She has no wheezes. She has no rales.  Abdominal: Soft. She exhibits no distension. There is no tenderness. There is no rebound and no guarding.  Musculoskeletal: She exhibits tenderness. She exhibits no edema.       Left wrist: She exhibits decreased range of motion, tenderness and swelling.       Arms: Neurological: She is alert and oriented to person, place, and time.  Skin: Skin is warm and dry. No rash noted. No erythema.  Psychiatric: She has a normal mood and affect. Her behavior is normal.  Nursing note and vitals reviewed.   ED Course  Procedures (including critical care time) Labs Review Labs Reviewed  I-STAT CHEM 8, ED - Abnormal; Notable for the following:    Sodium 133 (*)    Chloride 95 (*)    Glucose, Bld 112 (*)    Calcium, Ion 1.09 (*)    All other components within normal limits  CBC WITH DIFFERENTIAL/PLATELET  URINALYSIS, ROUTINE W REFLEX MICROSCOPIC (NOT AT [facility])    Imaging Review Dg Wrist Complete Left  01/16/2015  CLINICAL DATA:  Left wrist swelling, redness, and pain. No indication of trauma. EXAM: LEFT WRIST - COMPLETE 3+ VIEW COMPARISON:  None. FINDINGS: Extensive chondrocalcinosis, greatest at the triangular fibrocartilage. No associated erosive changes. Scapholunate widening, likely degenerative ligamentous insufficiency. Severe first CMC and STT osteoarthritis. There is chronic subluxation of the first [facility] base with remarkably distorted joint. Osteopenia. No acute fracture. IMPRESSION: 1. No acute finding. 2. Extensive chondrocalcinosis, question CPPD arthropathy as cause of inflammatory symptoms. 3.  Degenerative changes described above. Electronically Signed   By: Monte Fantasia M.D.   On: 01/16/2015 08:19   I have personally reviewed and evaluated these images and lab results as part of  my medical decision-making.   EKG Interpretation None      MDM   Final diagnoses:  Pseudogout of wrist, left    Patient is a 81 year old female with a history of arthritis, heart valve disorder, hypertension, stroke who presents with pain swelling and redness in the left wrist that started last night. She denies any trauma and denies any recent infectious symptoms except for being treated for UTI last week the Cipro. Patient has never had gout in the past however on exam patient's wrist joint appears to be from gout with warmth, redness and swelling. Lower suspicion for septic arthritis as patient is afebrile and can move the wrist.  Imaging shows  extensive chondrocalcinosis with a question of pseudogout as the cause of patient's inflammation on x-ray without other acute findings. Patient is afebrile here and hypertensive. She was given a Percocet upon arrival for pain control which she states has significantly improved her symptoms.  Patient lives in independent living but her daughter is present and states someone can stay with her and can get a wheelchair needed to help ambulate because she normally uses a walker.  CBC, Chem-8, UA without acute findings.  10:18 AM Mild leukocytosis of 14,000, labs otherwise without acute findings. UA within normal limits. Patient and daughter instructed to follow-up with PCP in 2 days for recheck or return here sooner for spreading erythema, fever or other concerns. She will be given colchicine and pain control.    Blanchie Dessert, MD 01/16/15 1019  Blanchie Dessert, MD 01/16/15 1031

## 2015-01-16 NOTE — ED Notes (Signed)
Arrives via [facility] from [place] (independant living) c/o left wrist swelling, redness and pain - denies fall/trauma, no other complaints, VSS, A/O X4, ambulatory and in NAD

## 2015-01-16 NOTE — ED Notes (Signed)
Paged Ortho  

## 2015-01-16 NOTE — Discharge Instructions (Signed)
Calcium Pyrophosphate Deposition   Calcium pyrophosphate deposition (CPPD), which is also called pseudogout, is a type of arthritis that causes pain, swelling, and inflammation in a joint. The joint pain can be severe and may last for days. If it is not treated, the pain may last much longer. Attacks of CPPD may come and go. This condition usually affects one joint at a time. The joints that are affected most commonly are the knees, but this condition can also affect the wrists, elbows, shoulders, or ankles.  CPPD is similar to gout. Both conditions result from the buildup of crystals in the joint. However, CPPD is caused by a type of crystal that is different than the crystals that cause gout.  CAUSES  This condition is caused by the buildup of calcium pyrophosphate dihydrate crystals in the joint. The reason why this buildup occurs is not known. The condition may be passed down from parent to child (hereditary).  RISK FACTORS  This condition is more likely to develop in people who:  · Are over 60 years old.  · Have a family history of the condition.  · Have had joint replacement surgery.  · Have had a recent injury.  · Have certain medical conditions, such as hemophilia, ochronosis, amyloidosis, or hormonal disorders.  · Have low blood magnesium levels.  SYMPTOMS  Symptoms of this condition include:  · Pain in a joint. The pain may:    Be intense and constant.    Come on quickly.    Get worse with movement.    Last from several days to a few weeks.  · Redness, swelling, and warmth at the joint.  · Stiffness of the joint.  DIAGNOSIS  To diagnose this condition, your health care provider will use a needle to remove fluid from the joint. The fluid will be examined under a microscope to check for the crystals that cause CPPD. You may also have imaging tests, such as:  · X-rays.  · Ultrasound.  TREATMENT  There is no way to remove the crystals from the joint and no way to cure this condition. However, treatment can  relieve symptoms and improve joint function. Treatment may include:  · Nonsteroidal anti-inflammatory drugs (NSAIDs) to reduce inflammation and pain.  · Medicines to help prevent attacks.  · Injections of medicine (cortisone) into the joint to reduce pain and swelling.  · Physical therapy to improve joint function.  HOME CARE INSTRUCTIONS  · Take medicines only as directed by your health care provider.  · Rest the affected joints until your symptoms start to go away.  · Keep your affected joints raised (elevated) when possible. This will help to reduce swelling.  · If directed, apply ice to the affected area:    Put ice in a plastic bag.    Place a towel between your skin and the bag.    Leave the ice on for 20 minutes, 2-3 times per day.  · If the painful joint is in your leg, use crutches as directed by your health care provider.  · When your symptoms start to go away, begin to exercise regularly or do physical therapy. Talk with your health care provider or physical therapist about what types of exercise are safe for you. Low-impact exercise may be best. This includes walking, swimming, bicycling, and water aerobics.  · Maintain a healthy weight so your joints do not need to bear more weight than necessary.  SEEK MEDICAL CARE IF:  · You have an increase   in joint pain that is not relieved with medicine.  · Your joint becomes more red, swollen, or stiff.  · You have a fever.  · You have a skin rash.     This information is not intended to replace advice given to you by your health care provider. Make sure you discuss any questions you have with your health care provider.     Document Released: 12/01/2003 Document Revised: 07/25/2014 Document Reviewed: 02/15/2014  Elsevier Interactive Patient Education ©2016 [employer]

## 2015-01-16 NOTE — ED Notes (Signed)
MD Plunkett at the bedside  

## 2015-01-16 NOTE — ED Notes (Signed)
Pt returned from X Ray.

## 2015-01-18 DIAGNOSIS — M11242 Other chondrocalcinosis, left hand: Secondary | ICD-10-CM | POA: Diagnosis not present

## 2015-04-12 DIAGNOSIS — K219 Gastro-esophageal reflux disease without esophagitis: Secondary | ICD-10-CM | POA: Diagnosis not present

## 2015-04-12 DIAGNOSIS — I10 Essential (primary) hypertension: Secondary | ICD-10-CM | POA: Diagnosis not present

## 2015-04-12 DIAGNOSIS — M159 Polyosteoarthritis, unspecified: Secondary | ICD-10-CM | POA: Diagnosis not present

## 2015-04-19 DIAGNOSIS — M11232 Other chondrocalcinosis, left wrist: Secondary | ICD-10-CM | POA: Diagnosis not present

## 2015-04-19 DIAGNOSIS — I10 Essential (primary) hypertension: Secondary | ICD-10-CM | POA: Diagnosis not present

## 2015-04-19 DIAGNOSIS — J4520 Mild intermittent asthma, uncomplicated: Secondary | ICD-10-CM | POA: Diagnosis not present

## 2015-04-19 DIAGNOSIS — M159 Polyosteoarthritis, unspecified: Secondary | ICD-10-CM | POA: Diagnosis not present

## 2015-05-12 DIAGNOSIS — M25551 Pain in right hip: Secondary | ICD-10-CM | POA: Diagnosis not present

## 2015-05-12 DIAGNOSIS — S335XXA Sprain of ligaments of lumbar spine, initial encounter: Secondary | ICD-10-CM | POA: Diagnosis not present

## 2015-05-14 ENCOUNTER — Emergency Department (HOSPITAL_COMMUNITY): Payer: Medicare Other

## 2015-05-14 ENCOUNTER — Encounter (HOSPITAL_COMMUNITY): Payer: Self-pay | Admitting: Emergency Medicine

## 2015-05-14 ENCOUNTER — Emergency Department (HOSPITAL_COMMUNITY)
Admission: EM | Admit: 2015-05-14 | Discharge: 2015-05-14 | Disposition: A | Discharge status: Home / Self Care | Payer: Medicare Other | Attending: Emergency Medicine | Admitting: Emergency Medicine

## 2015-05-14 DIAGNOSIS — Z79899 Other long term (current) drug therapy: Secondary | ICD-10-CM | POA: Insufficient documentation

## 2015-05-14 DIAGNOSIS — Y92091 Bathroom in other non-institutional residence as the place of occurrence of the external cause: Secondary | ICD-10-CM | POA: Diagnosis not present

## 2015-05-14 DIAGNOSIS — Y9389 Activity, other specified: Secondary | ICD-10-CM | POA: Diagnosis not present

## 2015-05-14 DIAGNOSIS — Z87410 Personal history of cervical dysplasia: Secondary | ICD-10-CM | POA: Diagnosis not present

## 2015-05-14 DIAGNOSIS — E876 Hypokalemia: Secondary | ICD-10-CM | POA: Diagnosis not present

## 2015-05-14 DIAGNOSIS — S3992XA Unspecified injury of lower back, initial encounter: Secondary | ICD-10-CM | POA: Insufficient documentation

## 2015-05-14 DIAGNOSIS — Z8739 Personal history of other diseases of the musculoskeletal system and connective tissue: Secondary | ICD-10-CM | POA: Insufficient documentation

## 2015-05-14 DIAGNOSIS — Y998 Other external cause status: Secondary | ICD-10-CM | POA: Insufficient documentation

## 2015-05-14 DIAGNOSIS — Z87448 Personal history of other diseases of urinary system: Secondary | ICD-10-CM | POA: Diagnosis not present

## 2015-05-14 DIAGNOSIS — Z7982 Long term (current) use of aspirin: Secondary | ICD-10-CM | POA: Diagnosis not present

## 2015-05-14 DIAGNOSIS — S0990XA Unspecified injury of head, initial encounter: Secondary | ICD-10-CM | POA: Insufficient documentation

## 2015-05-14 DIAGNOSIS — R0602 Shortness of breath: Secondary | ICD-10-CM | POA: Diagnosis not present

## 2015-05-14 DIAGNOSIS — S3991XA Unspecified injury of abdomen, initial encounter: Secondary | ICD-10-CM | POA: Insufficient documentation

## 2015-05-14 DIAGNOSIS — E871 Hypo-osmolality and hyponatremia: Secondary | ICD-10-CM | POA: Diagnosis not present

## 2015-05-14 DIAGNOSIS — W01198A Fall on same level from slipping, tripping and stumbling with subsequent striking against other object, initial encounter: Secondary | ICD-10-CM | POA: Diagnosis not present

## 2015-05-14 DIAGNOSIS — Z8673 Personal history of transient ischemic attack (TIA), and cerebral infarction without residual deficits: Secondary | ICD-10-CM | POA: Insufficient documentation

## 2015-05-14 DIAGNOSIS — I10 Essential (primary) hypertension: Secondary | ICD-10-CM | POA: Insufficient documentation

## 2015-05-14 DIAGNOSIS — K219 Gastro-esophageal reflux disease without esophagitis: Secondary | ICD-10-CM | POA: Insufficient documentation

## 2015-05-14 DIAGNOSIS — Z880 Allergy status to penicillin: Secondary | ICD-10-CM | POA: Insufficient documentation

## 2015-05-14 DIAGNOSIS — W19XXXA Unspecified fall, initial encounter: Secondary | ICD-10-CM

## 2015-05-14 LAB — URINALYSIS, ROUTINE W REFLEX MICROSCOPIC
Bilirubin Urine: NEGATIVE
GLUCOSE, UA: NEGATIVE mg/dL
KETONES UR: 15 mg/dL — AB
Leukocytes, UA: NEGATIVE
Nitrite: NEGATIVE
PROTEIN: 30 mg/dL — AB
Specific Gravity, Urine: 1.009 (ref 1.005–1.030)
pH: 6.5 (ref 5.0–8.0)

## 2015-05-14 LAB — CBC WITH DIFFERENTIAL/PLATELET
BASOS ABS: 0 10*3/uL (ref 0.0–0.1)
Basophils Relative: 0 %
EOS PCT: 3 %
Eosinophils Absolute: 0.3 10*3/uL (ref 0.0–0.7)
HCT: 41 % (ref 36.0–46.0)
Hemoglobin: 14.2 g/dL (ref 12.0–15.0)
LYMPHS ABS: 2.2 10*3/uL (ref 0.7–4.0)
LYMPHS PCT: 19 %
MCH: 30.4 pg (ref 26.0–34.0)
MCHC: 34.6 g/dL (ref 30.0–36.0)
MCV: 87.8 fL (ref 78.0–100.0)
MONO ABS: 0.9 10*3/uL (ref 0.1–1.0)
Monocytes Relative: 8 %
Neutro Abs: 8.1 10*3/uL — ABNORMAL HIGH (ref 1.7–7.7)
Neutrophils Relative %: 70 %
PLATELETS: 254 10*3/uL (ref 150–400)
RBC: 4.67 MIL/uL (ref 3.87–5.11)
RDW: 13.9 % (ref 11.5–15.5)
WBC: 11.6 10*3/uL — ABNORMAL HIGH (ref 4.0–10.5)

## 2015-05-14 LAB — BASIC METABOLIC PANEL
Anion gap: 14 (ref 5–15)
BUN: 13 mg/dL (ref 6–20)
CHLORIDE: 92 mmol/L — AB (ref 101–111)
CO2: 23 mmol/L (ref 22–32)
Calcium: 9.2 mg/dL (ref 8.9–10.3)
Creatinine, Ser: 0.85 mg/dL (ref 0.44–1.00)
GFR calc Af Amer: >60 mL/min (ref >60)
GFR, EST NON AFRICAN AMERICAN: 57 mL/min — AB (ref >60)
Glucose, Bld: 107 mg/dL — ABNORMAL HIGH (ref 65–99)
POTASSIUM: 3 mmol/L — AB (ref 3.5–5.1)
Sodium: 129 mmol/L — ABNORMAL LOW (ref 135–145)

## 2015-05-14 LAB — URINE MICROSCOPIC-ADD ON

## 2015-05-14 LAB — I-STAT TROPONIN, ED: Troponin i, poc: 0.05 ng/mL (ref 0.00–0.08)

## 2015-05-14 MED ORDER — SODIUM CHLORIDE 0.9 % IV BOLUS (SEPSIS)
500.0000 mL | Freq: Once | INTRAVENOUS | Status: AC
  Administered 2015-05-14: 500 mL via INTRAVENOUS

## 2015-05-14 MED ORDER — POTASSIUM CHLORIDE ER 20 MEQ PO TBCR: 20.0000 meq | EXTENDED_RELEASE_TABLET | Freq: Every day | ORAL | Status: DC

## 2015-05-14 MED ORDER — POTASSIUM CHLORIDE CRYS ER 20 MEQ PO TBCR
40.0000 meq | EXTENDED_RELEASE_TABLET | Freq: Once | ORAL | Status: AC
  Administered 2015-05-14: 40 meq via ORAL
  Filled 2015-05-14: qty 2

## 2015-05-14 MED ORDER — HYDROCODONE-ACETAMINOPHEN 5-325 MG PO TABS: 0.5000 | ORAL_TABLET | Freq: Three times a day (TID) | ORAL | Status: DC | PRN

## 2015-05-14 NOTE — ED Notes (Signed)
Dr. Rees at bedside  

## 2015-05-14 NOTE — ED Notes (Addendum)
Pt from home brought in by daughter for eval of fall on Saturday, pt states hit her head but denies any LOC. Pt was seen at orthopedic doc and had xrays done that showed compression fracture. Pt daughter states pt has been having intermittent confusion since then, pt alert and oriented x4 in triage, cooperating appropriately. nad ntoed. Hematoma noted to posterior head, tender to toouch.

## 2015-05-14 NOTE — Discharge Instructions (Signed)
Hyponatremia Hyponatremia is when the amount of salt (sodium) in your blood is too low. When sodium levels are low, your cells absorb extra water and they swell. The swelling happens throughout the body, but it mostly affects the brain. CAUSES This condition may be caused by:  Heart, kidney, or liver problems.  Thyroid problems.  Adrenal gland problems.  Metabolic conditions, such as syndrome of inappropriate antidiuretic hormone (SIADH).  Severe vomiting and diarrhea.  Certain medicines or illegal drugs.  Dehydration.  Drinking too much water.  Eating a diet that is low in sodium.  Large burns on your body.  Sweating. RISK FACTORS This condition is more likely to develop in people who:  Have long-term (chronic) kidney disease.  Have heart failure.  Have a medical condition that causes frequent or excessive diarrhea.  Have metabolic conditions, such as Addison disease or SIADH.  Take certain medicines that affect the sodium and fluid balance in the blood. Some of these medicine types include:  Diuretics.  NSAIDs.  Some opioid pain medicines.  Some antidepressants.  Some seizure prevention medicines. SYMPTOMS  Symptoms of this condition include:  Nausea and vomiting.  Confusion.  Lethargy.  Agitation.  Headache.  Seizures.  Unconsciousness.  Appetite loss.  Muscle weakness and cramping.  Feeling weak or light-headed.  Having a rapid heart rate.  Fainting, in severe cases. DIAGNOSIS This condition is diagnosed with a medical history and physical exam. You will also have other tests, including:  Blood tests.  Urine tests. TREATMENT Treatment for this condition depends on the cause. Treatment may include:  Fluids given through an IV tube that is inserted into one of your veins.  Medicines to correct the sodium imbalance. If medicines are causing the condition, the medicines will need to be adjusted.  Limiting water or fluid intake to  get the correct sodium balance. HOME CARE INSTRUCTIONS  Take medicines only as directed by your health care provider. Many medicines can make this condition worse. Talk with your health care provider about any medicines that you are currently taking.  Carefully follow a recommended diet as directed by your health care provider.  Carefully follow instructions from your health care provider about fluid restrictions.  Keep all follow-up visits as directed by your health care provider. This is important.  Do not drink alcohol. SEEK MEDICAL CARE IF:  You develop worsening nausea, fatigue, headache, confusion, or weakness.  Your symptoms go away and then return.  You have problems following the recommended diet. SEEK IMMEDIATE MEDICAL CARE IF:  You have a seizure.  You faint.  You have ongoing diarrhea or vomiting.   This information is not intended to replace advice given to you by your health care provider. Make sure you discuss any questions you have with your health care provider.   Document Released: 02/28/2002 Document Revised: 07/25/2014 Document Reviewed: 03/30/2014 Elsevier Interactive Patient Education 2016 [employer].  Hypokalemia Hypokalemia means that the amount of potassium in the blood is lower than normal.Potassium is a chemical, called an electrolyte, that helps regulate the amount of fluid in the body. It also stimulates muscle contraction and helps nerves function properly.Most of the body's potassium is inside of cells, and only a very small amount is in the blood. Because the amount in the blood is so small, minor changes can be life-threatening. CAUSES  Antibiotics.  Diarrhea or vomiting.  Using laxatives too much, which can cause diarrhea.  Chronic kidney disease.  Water pills (diuretics).  Eating disorders (bulimia).  Low magnesium level.  Sweating a lot. SIGNS AND SYMPTOMS  Weakness.  Constipation.  Fatigue.  Muscle cramps.  Mental  confusion.  Skipped heartbeats or irregular heartbeat (palpitations).  Tingling or numbness. DIAGNOSIS  Your health care provider can diagnose hypokalemia with blood tests. In addition to checking your potassium level, your health care provider may also check other lab tests. TREATMENT Hypokalemia can be treated with potassium supplements taken by mouth or adjustments in your current medicines. If your potassium level is very low, you may need to get potassium through a vein (IV) and be monitored in the hospital. A diet high in potassium is also helpful. Foods high in potassium are:  Nuts, such as peanuts and pistachios.  Seeds, such as sunflower seeds and pumpkin seeds.  Peas, lentils, and lima beans.  Whole grain and bran cereals and breads.  Fresh fruit and vegetables, such as apricots, avocado, bananas, cantaloupe, kiwi, oranges, tomatoes, asparagus, and potatoes.  Orange and tomato juices.  Red meats.  Fruit yogurt. HOME CARE INSTRUCTIONS  Take all medicines as prescribed by your health care provider.  Maintain a healthy diet by including nutritious food, such as fruits, vegetables, nuts, whole grains, and lean meats.  If you are taking a laxative, be sure to follow the directions on the label. SEEK MEDICAL CARE IF:  Your weakness gets worse.  You feel your heart pounding or racing.  You are vomiting or having diarrhea.  You are diabetic and having trouble keeping your blood glucose in the normal range. SEEK IMMEDIATE MEDICAL CARE IF:  You have chest pain, shortness of breath, or dizziness.  You are vomiting or having diarrhea for more than 2 days.  You faint. MAKE SURE YOU:   Understand these instructions.  Will watch your condition.  Will get help right away if you are not doing well or get worse.   This information is not intended to replace advice given to you by your health care provider. Make sure you discuss any questions you have with your health  care provider.   Document Released: 03/10/2005 Document Revised: 03/31/2014 Document Reviewed: 09/10/2012 Elsevier Interactive Patient Education [employer].

## 2015-05-14 NOTE — ED Notes (Signed)
Gave pt hot tea and [place] sandwich

## 2015-05-14 NOTE — ED Provider Notes (Signed)
CSN: [id number]     Arrival date & time 05/14/15  1210 History   First MD Initiated Contact with Patient 05/14/15 1746     Chief Complaint  Patient presents with  . Fall  . Altered Mental Status      The history is provided by the patient and a relative. No language interpreter was used.   [name] is a 82 y.o. female who presents to the Emergency Department complaining of fall. History is provided by the patient and her daughter. She had a fall 3 days ago. When she was leaving the bathroom she turned to close the shower door and fell backwards striking her head on the counter and sliding down to the ground. She had no loss of consciousness. Pain to her head at that time and she placed an ice pack. She saw orthopedic urgent care 2 days ago because she had some back pain following the fall and she had x-rays that showed she may have a compression fracture in her lower back. Since the fall she reports intermittent nausea, intermittent lower abdominal discomfort, back pain. Her daughter reports some increased confusion since the fall. No fevers, chest pain, vomiting. She has chronic shortness of breath and states this is at her baseline. She resides at an independent living facility.  Past Medical History  Diagnosis Date  . Heart valve disorder     "leaking"  . Hypertension   . Stroke [facility])     mild strokes  . Osteoporosis   . Cervical dysplasia   . Cystocele   . Rectocele   . Acid reflux   . Urinary incontinence    Past Surgical History  Procedure Laterality Date  . Total abdominal hysterectomy  1991    TAH, A/P REPAIR  . Cholecystectomy  2003  . Total hip arthroplasty      X2  . Inguinal hernia repair    . Appendectomy    . Prolapsed intestines    . Surgery for adhesions related to intestines    . Colposcopy    . Bladder suspension     Family History  Problem Relation Age of Onset  . Cancer Mother     COLON  . Hypertension Father   . Heart disease Father   . Heart  disease Sister   . Emphysema Brother    Social History  Substance Use Topics  . Smoking status: Never Smoker   . Smokeless tobacco: None  . Alcohol Use: 2.0 oz/week    4 drink(s) per week   OB History    Gravida Para Term Preterm AB TAB SAB Ectopic Multiple Living   6 2 2  4     2      Review of Systems  All other systems reviewed and are negative.     Allergies  Aspirin; Penicillins; and Quinine derivatives  Home Medications   Prior to Admission medications   Medication Sig Start Date End Date Taking? Authorizing Provider  acetaminophen (TYLENOL) 500 MG tablet Take 500 mg by mouth every 6 (six) hours as needed for mild pain.   Yes Historical Provider, MD  aspirin 81 MG tablet Take 81 mg by mouth every other day.    Yes Historical Provider, MD  bisoprolol (ZEBETA) 5 MG tablet Take 5 mg by mouth daily.   Yes Historical Provider, MD  guanFACINE (TENEX) 2 MG tablet Take 1 mg by mouth at bedtime.    Yes Historical Provider, MD  isradipine (DYNACIRC) 5 MG capsule Take  5 mg by mouth daily.   Yes Historical Provider, MD  losartan (COZAAR) 100 MG tablet Take 100 mg by mouth daily.   Yes Historical Provider, MD  omeprazole (PRILOSEC) 20 MG capsule Take 20 mg by mouth daily.   Yes Historical Provider, MD  Polyvinyl Alcohol-Povidone (REFRESH OP) Place 1 drop into both eyes daily. Take every day per patient   Yes Historical Provider, MD  HYDROcodone-acetaminophen (NORCO/VICODIN) 5-325 MG tablet Take 0.5 tablets by mouth every 8 (eight) hours as needed for severe pain. 05/14/15   Quintella Reichert, MD  potassium chloride 20 MEQ TBCR Take 20 mEq by mouth daily. 05/14/15   Quintella Reichert, MD   BP 179/81 mmHg  Pulse 85  Temp(Src) 97.9 F (36.6 C) (Oral)  Resp 19  Ht 4\' 8"  (1.422 m)  Wt 100 lb (45.36 kg)  BMI 22.43 kg/m2  SpO2 95% Physical Exam  Constitutional: She is oriented to person, place, and time. She appears well-developed and well-nourished.  HENT:  Head: Normocephalic and  atraumatic.  Cardiovascular: Normal rate and regular rhythm.   No murmur heard. Pulmonary/Chest: Effort normal and breath sounds normal. No respiratory distress.  Abdominal: Soft. There is no tenderness. There is no rebound and no guarding.  Musculoskeletal: She exhibits no edema.  Mild tenderness over the lower back. Kyphoscoliosis.  Neurological: She is alert and oriented to person, place, and time.  Skin: Skin is warm and dry.  Psychiatric: She has a normal mood and affect. Her behavior is normal.  Nursing note and vitals reviewed.   ED Course  Procedures (including critical care time) Labs Review Labs Reviewed  BASIC METABOLIC PANEL - Abnormal; Notable for the following:    Sodium 129 (*)    Potassium 3.0 (*)    Chloride 92 (*)    Glucose, Bld 107 (*)    GFR calc non Af Amer 57 (*)    All other components within normal limits  CBC WITH DIFFERENTIAL/PLATELET - Abnormal; Notable for the following:    WBC 11.6 (*)    Neutro Abs 8.1 (*)    All other components within normal limits  URINALYSIS, ROUTINE W REFLEX MICROSCOPIC (NOT AT [facility]) - Abnormal; Notable for the following:    Hgb urine dipstick TRACE (*)    Ketones, ur 15 (*)    Protein, ur 30 (*)    All other components within normal limits  URINE MICROSCOPIC-ADD ON - Abnormal; Notable for the following:    Squamous Epithelial / LPF 0-5 (*)    Bacteria, UA FEW (*)    All other components within normal limits  URINE CULTURE  I-STAT TROPOININ, ED    Imaging Review Dg Chest 2 View  05/14/2015  CLINICAL DATA:  [place] at home 3 days ago with shortness of breath today. EXAM: CHEST  2 VIEW COMPARISON:  None. FINDINGS: The cardio pericardial silhouette is enlarged. The lungs are clear wiithout focal pneumonia, edema, pneumothorax or pleural effusion. Interstitial markings are diffusely coarsened with chronic features. Anterior wedge compression deformity in the region of the thoracolumbar junction appears to be the T12 vertebral  body and results in focal kyphosis. IMPRESSION: Cardiomegaly with underlying chronic interstitial changes. Osteopenia with age indeterminate anterior wedge compression deformity at approximately the level of T12. Electronically Signed   By: Misty Stanley M.D.   On: 05/14/2015 19:07   Ct Head Wo Contrast  05/14/2015  CLINICAL DATA:  [place] this morning. Struck back of her head, Has a hematoma on her head. Denies LOC  Hx: stroke, HTN EXAM: CT HEAD WITHOUT CONTRAST TECHNIQUE: Contiguous axial images were obtained from the base of the skull through the vertex without intravenous contrast. COMPARISON:  08/03/2007 FINDINGS: Brain: Diffuse parenchymal atrophy. Patchy areas of hypoattenuation in deep and periventricular white matter bilaterally. Negative for acute intracranial hemorrhage, mass lesion, acute infarction, midline shift, or mass-effect. Acute infarct may be inapparent on noncontrast CT. Ventricles and sulci symmetric. Vascular: No hyperdense vessel or unexpected calcification. Atherosclerotic and physiologic intracranial calcifications. Skull: Negative for fracture or focal lesion. Sinuses/Orbits: No acute findings. Other: None. IMPRESSION: 1. Negative for bleed or other acute intracranial process. 2. Atrophy and nonspecific white matter changes. Electronically Signed   By: Lucrezia Europe M.D.   On: 05/14/2015 13:59   I have personally reviewed and evaluated these images and lab results as part of my medical decision-making.   EKG Interpretation   Date/Time:  Monday May 14 2015 18:21:21 EST Ventricular Rate:  88 PR Interval:  290 QRS Duration: 97 QT Interval:  381 QTC Calculation: 461 R Axis:   -31 Text Interpretation:  Sinus rhythm Prolonged PR interval Left ventricular  hypertrophy Anterior Q waves, possibly due to LVH Confirmed by Hazle Coca  [phone]) on 05/14/2015 6:39:45 PM      MDM   Final diagnoses:  Fall, initial encounter  Hypokalemia  Hyponatremia    Patient here for  evaluation of injuries following a fall a few days ago, she had outpatient imaging that demonstrated compression fracture of the pack. Repeat lumbar imaging not performed today. The patient is mildly hyponatremic and hypokalemic. UA is not consistent with UTI. Discussed treating her hypokalemia and hyponatremia as well as pain control for her compression fracture. Plan to DC home with very close return precautions and very close outpatient follow-up.    Quintella Reichert, MD 05/14/15 [phone]

## 2015-05-15 LAB — URINE CULTURE

## 2015-05-22 DIAGNOSIS — I10 Essential (primary) hypertension: Secondary | ICD-10-CM | POA: Diagnosis not present

## 2015-05-24 DIAGNOSIS — E876 Hypokalemia: Secondary | ICD-10-CM | POA: Diagnosis not present

## 2015-05-24 DIAGNOSIS — W19XXXA Unspecified fall, initial encounter: Secondary | ICD-10-CM | POA: Diagnosis not present

## 2015-05-24 DIAGNOSIS — M4850XA Collapsed vertebra, not elsewhere classified, site unspecified, initial encounter for fracture: Secondary | ICD-10-CM | POA: Diagnosis not present

## 2015-05-30 DIAGNOSIS — M549 Dorsalgia, unspecified: Secondary | ICD-10-CM | POA: Diagnosis not present

## 2015-06-07 DIAGNOSIS — L821 Other seborrheic keratosis: Secondary | ICD-10-CM | POA: Diagnosis not present

## 2015-06-07 DIAGNOSIS — Z85828 Personal history of other malignant neoplasm of skin: Secondary | ICD-10-CM | POA: Diagnosis not present

## 2015-06-07 DIAGNOSIS — L570 Actinic keratosis: Secondary | ICD-10-CM | POA: Diagnosis not present

## 2015-06-07 DIAGNOSIS — D2271 Melanocytic nevi of right lower limb, including hip: Secondary | ICD-10-CM | POA: Diagnosis not present

## 2015-06-07 DIAGNOSIS — D225 Melanocytic nevi of trunk: Secondary | ICD-10-CM | POA: Diagnosis not present

## 2015-06-07 DIAGNOSIS — Z23 Encounter for immunization: Secondary | ICD-10-CM | POA: Diagnosis not present

## 2015-06-11 DIAGNOSIS — M810 Age-related osteoporosis without current pathological fracture: Secondary | ICD-10-CM | POA: Diagnosis not present

## 2015-06-11 DIAGNOSIS — J45909 Unspecified asthma, uncomplicated: Secondary | ICD-10-CM | POA: Diagnosis not present

## 2015-06-11 DIAGNOSIS — W19XXXD Unspecified fall, subsequent encounter: Secondary | ICD-10-CM | POA: Diagnosis not present

## 2015-06-11 DIAGNOSIS — M159 Polyosteoarthritis, unspecified: Secondary | ICD-10-CM | POA: Diagnosis not present

## 2015-06-11 DIAGNOSIS — M4850XD Collapsed vertebra, not elsewhere classified, site unspecified, subsequent encounter for fracture with routine healing: Secondary | ICD-10-CM | POA: Diagnosis not present

## 2015-06-11 DIAGNOSIS — I10 Essential (primary) hypertension: Secondary | ICD-10-CM | POA: Diagnosis not present

## 2015-06-15 DIAGNOSIS — M810 Age-related osteoporosis without current pathological fracture: Secondary | ICD-10-CM | POA: Diagnosis not present

## 2015-06-15 DIAGNOSIS — W19XXXD Unspecified fall, subsequent encounter: Secondary | ICD-10-CM | POA: Diagnosis not present

## 2015-06-15 DIAGNOSIS — J45909 Unspecified asthma, uncomplicated: Secondary | ICD-10-CM | POA: Diagnosis not present

## 2015-06-15 DIAGNOSIS — M159 Polyosteoarthritis, unspecified: Secondary | ICD-10-CM | POA: Diagnosis not present

## 2015-06-15 DIAGNOSIS — I10 Essential (primary) hypertension: Secondary | ICD-10-CM | POA: Diagnosis not present

## 2015-06-15 DIAGNOSIS — M4850XD Collapsed vertebra, not elsewhere classified, site unspecified, subsequent encounter for fracture with routine healing: Secondary | ICD-10-CM | POA: Diagnosis not present

## 2015-06-18 DIAGNOSIS — M4850XD Collapsed vertebra, not elsewhere classified, site unspecified, subsequent encounter for fracture with routine healing: Secondary | ICD-10-CM | POA: Diagnosis not present

## 2015-06-18 DIAGNOSIS — I10 Essential (primary) hypertension: Secondary | ICD-10-CM | POA: Diagnosis not present

## 2015-06-18 DIAGNOSIS — J45909 Unspecified asthma, uncomplicated: Secondary | ICD-10-CM | POA: Diagnosis not present

## 2015-06-18 DIAGNOSIS — M159 Polyosteoarthritis, unspecified: Secondary | ICD-10-CM | POA: Diagnosis not present

## 2015-06-18 DIAGNOSIS — W19XXXD Unspecified fall, subsequent encounter: Secondary | ICD-10-CM | POA: Diagnosis not present

## 2015-06-18 DIAGNOSIS — M810 Age-related osteoporosis without current pathological fracture: Secondary | ICD-10-CM | POA: Diagnosis not present

## 2015-06-20 DIAGNOSIS — M159 Polyosteoarthritis, unspecified: Secondary | ICD-10-CM | POA: Diagnosis not present

## 2015-06-20 DIAGNOSIS — M4850XD Collapsed vertebra, not elsewhere classified, site unspecified, subsequent encounter for fracture with routine healing: Secondary | ICD-10-CM | POA: Diagnosis not present

## 2015-06-20 DIAGNOSIS — J45909 Unspecified asthma, uncomplicated: Secondary | ICD-10-CM | POA: Diagnosis not present

## 2015-06-20 DIAGNOSIS — I10 Essential (primary) hypertension: Secondary | ICD-10-CM | POA: Diagnosis not present

## 2015-06-20 DIAGNOSIS — W19XXXD Unspecified fall, subsequent encounter: Secondary | ICD-10-CM | POA: Diagnosis not present

## 2015-06-20 DIAGNOSIS — M810 Age-related osteoporosis without current pathological fracture: Secondary | ICD-10-CM | POA: Diagnosis not present

## 2015-06-21 DIAGNOSIS — M810 Age-related osteoporosis without current pathological fracture: Secondary | ICD-10-CM | POA: Diagnosis not present

## 2015-06-21 DIAGNOSIS — M4850XD Collapsed vertebra, not elsewhere classified, site unspecified, subsequent encounter for fracture with routine healing: Secondary | ICD-10-CM | POA: Diagnosis not present

## 2015-06-25 DIAGNOSIS — W19XXXD Unspecified fall, subsequent encounter: Secondary | ICD-10-CM | POA: Diagnosis not present

## 2015-06-25 DIAGNOSIS — M810 Age-related osteoporosis without current pathological fracture: Secondary | ICD-10-CM | POA: Diagnosis not present

## 2015-06-25 DIAGNOSIS — M159 Polyosteoarthritis, unspecified: Secondary | ICD-10-CM | POA: Diagnosis not present

## 2015-06-25 DIAGNOSIS — J45909 Unspecified asthma, uncomplicated: Secondary | ICD-10-CM | POA: Diagnosis not present

## 2015-06-25 DIAGNOSIS — I10 Essential (primary) hypertension: Secondary | ICD-10-CM | POA: Diagnosis not present

## 2015-06-25 DIAGNOSIS — M4850XD Collapsed vertebra, not elsewhere classified, site unspecified, subsequent encounter for fracture with routine healing: Secondary | ICD-10-CM | POA: Diagnosis not present

## 2015-06-27 DIAGNOSIS — W19XXXD Unspecified fall, subsequent encounter: Secondary | ICD-10-CM | POA: Diagnosis not present

## 2015-06-27 DIAGNOSIS — M4850XD Collapsed vertebra, not elsewhere classified, site unspecified, subsequent encounter for fracture with routine healing: Secondary | ICD-10-CM | POA: Diagnosis not present

## 2015-06-27 DIAGNOSIS — I10 Essential (primary) hypertension: Secondary | ICD-10-CM | POA: Diagnosis not present

## 2015-06-27 DIAGNOSIS — M159 Polyosteoarthritis, unspecified: Secondary | ICD-10-CM | POA: Diagnosis not present

## 2015-06-27 DIAGNOSIS — J45909 Unspecified asthma, uncomplicated: Secondary | ICD-10-CM | POA: Diagnosis not present

## 2015-06-27 DIAGNOSIS — M810 Age-related osteoporosis without current pathological fracture: Secondary | ICD-10-CM | POA: Diagnosis not present

## 2015-07-07 DIAGNOSIS — W19XXXD Unspecified fall, subsequent encounter: Secondary | ICD-10-CM | POA: Diagnosis not present

## 2015-07-07 DIAGNOSIS — I10 Essential (primary) hypertension: Secondary | ICD-10-CM | POA: Diagnosis not present

## 2015-07-07 DIAGNOSIS — M810 Age-related osteoporosis without current pathological fracture: Secondary | ICD-10-CM | POA: Diagnosis not present

## 2015-07-07 DIAGNOSIS — M4850XD Collapsed vertebra, not elsewhere classified, site unspecified, subsequent encounter for fracture with routine healing: Secondary | ICD-10-CM | POA: Diagnosis not present

## 2015-07-07 DIAGNOSIS — J45909 Unspecified asthma, uncomplicated: Secondary | ICD-10-CM | POA: Diagnosis not present

## 2015-07-07 DIAGNOSIS — M159 Polyosteoarthritis, unspecified: Secondary | ICD-10-CM | POA: Diagnosis not present

## 2015-07-09 DIAGNOSIS — M810 Age-related osteoporosis without current pathological fracture: Secondary | ICD-10-CM | POA: Diagnosis not present

## 2015-07-09 DIAGNOSIS — W19XXXD Unspecified fall, subsequent encounter: Secondary | ICD-10-CM | POA: Diagnosis not present

## 2015-07-09 DIAGNOSIS — M4850XD Collapsed vertebra, not elsewhere classified, site unspecified, subsequent encounter for fracture with routine healing: Secondary | ICD-10-CM | POA: Diagnosis not present

## 2015-07-09 DIAGNOSIS — I10 Essential (primary) hypertension: Secondary | ICD-10-CM | POA: Diagnosis not present

## 2015-07-09 DIAGNOSIS — M159 Polyosteoarthritis, unspecified: Secondary | ICD-10-CM | POA: Diagnosis not present

## 2015-07-09 DIAGNOSIS — J45909 Unspecified asthma, uncomplicated: Secondary | ICD-10-CM | POA: Diagnosis not present

## 2015-07-11 DIAGNOSIS — I10 Essential (primary) hypertension: Secondary | ICD-10-CM | POA: Diagnosis not present

## 2015-07-11 DIAGNOSIS — M159 Polyosteoarthritis, unspecified: Secondary | ICD-10-CM | POA: Diagnosis not present

## 2015-07-11 DIAGNOSIS — J45909 Unspecified asthma, uncomplicated: Secondary | ICD-10-CM | POA: Diagnosis not present

## 2015-07-13 DIAGNOSIS — W19XXXD Unspecified fall, subsequent encounter: Secondary | ICD-10-CM | POA: Diagnosis not present

## 2015-07-13 DIAGNOSIS — J45909 Unspecified asthma, uncomplicated: Secondary | ICD-10-CM | POA: Diagnosis not present

## 2015-07-13 DIAGNOSIS — I10 Essential (primary) hypertension: Secondary | ICD-10-CM | POA: Diagnosis not present

## 2015-07-13 DIAGNOSIS — M159 Polyosteoarthritis, unspecified: Secondary | ICD-10-CM | POA: Diagnosis not present

## 2015-07-13 DIAGNOSIS — M810 Age-related osteoporosis without current pathological fracture: Secondary | ICD-10-CM | POA: Diagnosis not present

## 2015-07-13 DIAGNOSIS — M4850XD Collapsed vertebra, not elsewhere classified, site unspecified, subsequent encounter for fracture with routine healing: Secondary | ICD-10-CM | POA: Diagnosis not present

## 2015-07-16 DIAGNOSIS — M810 Age-related osteoporosis without current pathological fracture: Secondary | ICD-10-CM | POA: Diagnosis not present

## 2015-07-16 DIAGNOSIS — J45909 Unspecified asthma, uncomplicated: Secondary | ICD-10-CM | POA: Diagnosis not present

## 2015-07-16 DIAGNOSIS — M4850XD Collapsed vertebra, not elsewhere classified, site unspecified, subsequent encounter for fracture with routine healing: Secondary | ICD-10-CM | POA: Diagnosis not present

## 2015-07-16 DIAGNOSIS — I10 Essential (primary) hypertension: Secondary | ICD-10-CM | POA: Diagnosis not present

## 2015-07-16 DIAGNOSIS — M159 Polyosteoarthritis, unspecified: Secondary | ICD-10-CM | POA: Diagnosis not present

## 2015-07-16 DIAGNOSIS — W19XXXD Unspecified fall, subsequent encounter: Secondary | ICD-10-CM | POA: Diagnosis not present

## 2015-07-20 DIAGNOSIS — M810 Age-related osteoporosis without current pathological fracture: Secondary | ICD-10-CM | POA: Diagnosis not present

## 2015-07-20 DIAGNOSIS — J45909 Unspecified asthma, uncomplicated: Secondary | ICD-10-CM | POA: Diagnosis not present

## 2015-07-20 DIAGNOSIS — M159 Polyosteoarthritis, unspecified: Secondary | ICD-10-CM | POA: Diagnosis not present

## 2015-07-20 DIAGNOSIS — I10 Essential (primary) hypertension: Secondary | ICD-10-CM | POA: Diagnosis not present

## 2015-07-20 DIAGNOSIS — M4850XD Collapsed vertebra, not elsewhere classified, site unspecified, subsequent encounter for fracture with routine healing: Secondary | ICD-10-CM | POA: Diagnosis not present

## 2015-07-20 DIAGNOSIS — W19XXXD Unspecified fall, subsequent encounter: Secondary | ICD-10-CM | POA: Diagnosis not present

## 2015-07-24 DIAGNOSIS — M4850XD Collapsed vertebra, not elsewhere classified, site unspecified, subsequent encounter for fracture with routine healing: Secondary | ICD-10-CM | POA: Diagnosis not present

## 2015-07-24 DIAGNOSIS — M810 Age-related osteoporosis without current pathological fracture: Secondary | ICD-10-CM | POA: Diagnosis not present

## 2015-07-24 DIAGNOSIS — W19XXXD Unspecified fall, subsequent encounter: Secondary | ICD-10-CM | POA: Diagnosis not present

## 2015-07-24 DIAGNOSIS — I10 Essential (primary) hypertension: Secondary | ICD-10-CM | POA: Diagnosis not present

## 2015-07-24 DIAGNOSIS — M159 Polyosteoarthritis, unspecified: Secondary | ICD-10-CM | POA: Diagnosis not present

## 2015-07-24 DIAGNOSIS — J45909 Unspecified asthma, uncomplicated: Secondary | ICD-10-CM | POA: Diagnosis not present

## 2015-07-27 DIAGNOSIS — M159 Polyosteoarthritis, unspecified: Secondary | ICD-10-CM | POA: Diagnosis not present

## 2015-07-27 DIAGNOSIS — I10 Essential (primary) hypertension: Secondary | ICD-10-CM | POA: Diagnosis not present

## 2015-07-27 DIAGNOSIS — M4850XD Collapsed vertebra, not elsewhere classified, site unspecified, subsequent encounter for fracture with routine healing: Secondary | ICD-10-CM | POA: Diagnosis not present

## 2015-07-27 DIAGNOSIS — J45909 Unspecified asthma, uncomplicated: Secondary | ICD-10-CM | POA: Diagnosis not present

## 2015-07-27 DIAGNOSIS — W19XXXD Unspecified fall, subsequent encounter: Secondary | ICD-10-CM | POA: Diagnosis not present

## 2015-07-27 DIAGNOSIS — M810 Age-related osteoporosis without current pathological fracture: Secondary | ICD-10-CM | POA: Diagnosis not present

## 2015-07-31 DIAGNOSIS — M159 Polyosteoarthritis, unspecified: Secondary | ICD-10-CM | POA: Diagnosis not present

## 2015-07-31 DIAGNOSIS — I10 Essential (primary) hypertension: Secondary | ICD-10-CM | POA: Diagnosis not present

## 2015-07-31 DIAGNOSIS — J45909 Unspecified asthma, uncomplicated: Secondary | ICD-10-CM | POA: Diagnosis not present

## 2015-07-31 DIAGNOSIS — M4850XD Collapsed vertebra, not elsewhere classified, site unspecified, subsequent encounter for fracture with routine healing: Secondary | ICD-10-CM | POA: Diagnosis not present

## 2015-07-31 DIAGNOSIS — M810 Age-related osteoporosis without current pathological fracture: Secondary | ICD-10-CM | POA: Diagnosis not present

## 2015-07-31 DIAGNOSIS — W19XXXD Unspecified fall, subsequent encounter: Secondary | ICD-10-CM | POA: Diagnosis not present

## 2015-08-03 DIAGNOSIS — J45909 Unspecified asthma, uncomplicated: Secondary | ICD-10-CM | POA: Diagnosis not present

## 2015-08-03 DIAGNOSIS — M810 Age-related osteoporosis without current pathological fracture: Secondary | ICD-10-CM | POA: Diagnosis not present

## 2015-08-03 DIAGNOSIS — W19XXXD Unspecified fall, subsequent encounter: Secondary | ICD-10-CM | POA: Diagnosis not present

## 2015-08-03 DIAGNOSIS — I10 Essential (primary) hypertension: Secondary | ICD-10-CM | POA: Diagnosis not present

## 2015-08-03 DIAGNOSIS — M4850XD Collapsed vertebra, not elsewhere classified, site unspecified, subsequent encounter for fracture with routine healing: Secondary | ICD-10-CM | POA: Diagnosis not present

## 2015-08-03 DIAGNOSIS — M159 Polyosteoarthritis, unspecified: Secondary | ICD-10-CM | POA: Diagnosis not present

## 2015-08-07 DIAGNOSIS — W19XXXD Unspecified fall, subsequent encounter: Secondary | ICD-10-CM | POA: Diagnosis not present

## 2015-08-07 DIAGNOSIS — J45909 Unspecified asthma, uncomplicated: Secondary | ICD-10-CM | POA: Diagnosis not present

## 2015-08-07 DIAGNOSIS — M4850XD Collapsed vertebra, not elsewhere classified, site unspecified, subsequent encounter for fracture with routine healing: Secondary | ICD-10-CM | POA: Diagnosis not present

## 2015-08-07 DIAGNOSIS — I10 Essential (primary) hypertension: Secondary | ICD-10-CM | POA: Diagnosis not present

## 2015-08-07 DIAGNOSIS — M159 Polyosteoarthritis, unspecified: Secondary | ICD-10-CM | POA: Diagnosis not present

## 2015-08-07 DIAGNOSIS — M810 Age-related osteoporosis without current pathological fracture: Secondary | ICD-10-CM | POA: Diagnosis not present

## 2015-08-23 DIAGNOSIS — J4520 Mild intermittent asthma, uncomplicated: Secondary | ICD-10-CM | POA: Diagnosis not present

## 2015-08-23 DIAGNOSIS — M11232 Other chondrocalcinosis, left wrist: Secondary | ICD-10-CM | POA: Diagnosis not present

## 2015-08-23 DIAGNOSIS — N390 Urinary tract infection, site not specified: Secondary | ICD-10-CM | POA: Diagnosis not present

## 2015-08-23 DIAGNOSIS — Z0000 Encounter for general adult medical examination without abnormal findings: Secondary | ICD-10-CM | POA: Diagnosis not present

## 2015-08-23 DIAGNOSIS — I10 Essential (primary) hypertension: Secondary | ICD-10-CM | POA: Diagnosis not present

## 2015-08-23 DIAGNOSIS — M159 Polyosteoarthritis, unspecified: Secondary | ICD-10-CM | POA: Diagnosis not present

## 2015-08-30 DIAGNOSIS — J4520 Mild intermittent asthma, uncomplicated: Secondary | ICD-10-CM | POA: Diagnosis not present

## 2015-08-30 DIAGNOSIS — I10 Essential (primary) hypertension: Secondary | ICD-10-CM | POA: Diagnosis not present

## 2015-08-30 DIAGNOSIS — M4850XD Collapsed vertebra, not elsewhere classified, site unspecified, subsequent encounter for fracture with routine healing: Secondary | ICD-10-CM | POA: Diagnosis not present

## 2015-08-30 DIAGNOSIS — M159 Polyosteoarthritis, unspecified: Secondary | ICD-10-CM | POA: Diagnosis not present

## 2015-08-30 DIAGNOSIS — N390 Urinary tract infection, site not specified: Secondary | ICD-10-CM | POA: Diagnosis not present

## 2015-09-14 ENCOUNTER — Other Ambulatory Visit: Payer: Self-pay | Admitting: Gastroenterology

## 2015-09-14 DIAGNOSIS — K219 Gastro-esophageal reflux disease without esophagitis: Secondary | ICD-10-CM | POA: Diagnosis not present

## 2015-09-14 DIAGNOSIS — R1310 Dysphagia, unspecified: Secondary | ICD-10-CM

## 2015-09-20 ENCOUNTER — Other Ambulatory Visit: Payer: PRIVATE HEALTH INSURANCE

## 2015-09-20 ENCOUNTER — Observation Stay (HOSPITAL_COMMUNITY)
Admission: EM | Admit: 2015-09-20 | Discharge: 2015-09-23 | Disposition: A | Discharge status: Home Health | Payer: Medicare Other | Attending: Internal Medicine | Admitting: Internal Medicine

## 2015-09-20 ENCOUNTER — Encounter (HOSPITAL_COMMUNITY): Payer: Self-pay

## 2015-09-20 DIAGNOSIS — I10 Essential (primary) hypertension: Secondary | ICD-10-CM | POA: Diagnosis not present

## 2015-09-20 DIAGNOSIS — K921 Melena: Principal | ICD-10-CM | POA: Insufficient documentation

## 2015-09-20 DIAGNOSIS — Z8673 Personal history of transient ischemic attack (TIA), and cerebral infarction without residual deficits: Secondary | ICD-10-CM | POA: Insufficient documentation

## 2015-09-20 DIAGNOSIS — E876 Hypokalemia: Secondary | ICD-10-CM | POA: Diagnosis not present

## 2015-09-20 DIAGNOSIS — R1032 Left lower quadrant pain: Secondary | ICD-10-CM | POA: Diagnosis present

## 2015-09-20 DIAGNOSIS — K922 Gastrointestinal hemorrhage, unspecified: Secondary | ICD-10-CM | POA: Diagnosis not present

## 2015-09-20 DIAGNOSIS — Z880 Allergy status to penicillin: Secondary | ICD-10-CM | POA: Diagnosis not present

## 2015-09-20 DIAGNOSIS — Z96649 Presence of unspecified artificial hip joint: Secondary | ICD-10-CM | POA: Diagnosis not present

## 2015-09-20 DIAGNOSIS — M810 Age-related osteoporosis without current pathological fracture: Secondary | ICD-10-CM | POA: Insufficient documentation

## 2015-09-20 DIAGNOSIS — R269 Unspecified abnormalities of gait and mobility: Secondary | ICD-10-CM | POA: Diagnosis not present

## 2015-09-20 DIAGNOSIS — I071 Rheumatic tricuspid insufficiency: Secondary | ICD-10-CM | POA: Insufficient documentation

## 2015-09-20 DIAGNOSIS — Z66 Do not resuscitate: Secondary | ICD-10-CM | POA: Diagnosis not present

## 2015-09-20 DIAGNOSIS — R32 Unspecified urinary incontinence: Secondary | ICD-10-CM | POA: Diagnosis not present

## 2015-09-20 DIAGNOSIS — E871 Hypo-osmolality and hyponatremia: Secondary | ICD-10-CM | POA: Insufficient documentation

## 2015-09-20 DIAGNOSIS — R531 Weakness: Secondary | ICD-10-CM | POA: Insufficient documentation

## 2015-09-20 DIAGNOSIS — K5730 Diverticulosis of large intestine without perforation or abscess without bleeding: Secondary | ICD-10-CM | POA: Diagnosis not present

## 2015-09-20 DIAGNOSIS — Z7982 Long term (current) use of aspirin: Secondary | ICD-10-CM | POA: Diagnosis not present

## 2015-09-20 DIAGNOSIS — K219 Gastro-esophageal reflux disease without esophagitis: Secondary | ICD-10-CM | POA: Diagnosis not present

## 2015-09-20 DIAGNOSIS — R262 Difficulty in walking, not elsewhere classified: Secondary | ICD-10-CM | POA: Diagnosis not present

## 2015-09-20 LAB — CBC
HEMATOCRIT: 39.1 % (ref 36.0–46.0)
Hemoglobin: 13.6 g/dL (ref 12.0–15.0)
MCH: 30.8 pg (ref 26.0–34.0)
MCHC: 34.8 g/dL (ref 30.0–36.0)
MCV: 88.7 fL (ref 78.0–100.0)
PLATELETS: 322 10*3/uL (ref 150–400)
RBC: 4.41 MIL/uL (ref 3.87–5.11)
RDW: 13.6 % (ref 11.5–15.5)
WBC: 7.5 10*3/uL (ref 4.0–10.5)

## 2015-09-20 LAB — URINALYSIS, ROUTINE W REFLEX MICROSCOPIC
BILIRUBIN URINE: NEGATIVE
Glucose, UA: NEGATIVE mg/dL
Hgb urine dipstick: NEGATIVE
KETONES UR: NEGATIVE mg/dL
LEUKOCYTES UA: NEGATIVE
NITRITE: NEGATIVE
PH: 7.5 (ref 5.0–8.0)
Protein, ur: NEGATIVE mg/dL
SPECIFIC GRAVITY, URINE: 1.009 (ref 1.005–1.030)

## 2015-09-20 LAB — COMPREHENSIVE METABOLIC PANEL
ALBUMIN: 3.5 g/dL (ref 3.5–5.0)
ALT: 12 U/L — AB (ref 14–54)
AST: 31 U/L (ref 15–41)
Alkaline Phosphatase: 81 U/L (ref 38–126)
Anion gap: 9 (ref 5–15)
BUN: 14 mg/dL (ref 6–20)
CHLORIDE: 95 mmol/L — AB (ref 101–111)
CO2: 29 mmol/L (ref 22–32)
CREATININE: 0.88 mg/dL (ref 0.44–1.00)
Calcium: 9 mg/dL (ref 8.9–10.3)
GFR calc Af Amer: >60 mL/min (ref >60)
GFR calc non Af Amer: 55 mL/min — ABNORMAL LOW (ref >60)
GLUCOSE: 90 mg/dL (ref 65–99)
POTASSIUM: 3.3 mmol/L — AB (ref 3.5–5.1)
SODIUM: 133 mmol/L — AB (ref 135–145)
Total Bilirubin: 1 mg/dL (ref 0.3–1.2)
Total Protein: 7 g/dL (ref 6.5–8.1)

## 2015-09-20 LAB — ABO/RH: ABO/RH(D): O POS

## 2015-09-20 LAB — TYPE AND SCREEN
ABO/RH(D): O POS
Antibody Screen: NEGATIVE

## 2015-09-20 LAB — POC OCCULT BLOOD, ED: Fecal Occult Bld: POSITIVE — AB

## 2015-09-20 MED ORDER — LOSARTAN POTASSIUM 50 MG PO TABS
100.0000 mg | ORAL_TABLET | Freq: Every day | ORAL | Status: DC
  Administered 2015-09-20 – 2015-09-23 (×4): 100 mg via ORAL
  Filled 2015-09-20 (×4): qty 2

## 2015-09-20 MED ORDER — ISRADIPINE 5 MG PO CAPS: 5.0000 mg | ORAL_CAPSULE | Freq: Every day | ORAL | Status: DC

## 2015-09-20 MED ORDER — GUANFACINE HCL 1 MG PO TABS
1.0000 mg | ORAL_TABLET | Freq: Every day | ORAL | Status: DC
  Administered 2015-09-21 – 2015-09-22 (×2): 1 mg via ORAL
  Filled 2015-09-20 (×4): qty 1

## 2015-09-20 MED ORDER — HYDRALAZINE HCL 20 MG/ML IJ SOLN
10.0000 mg | Freq: Four times a day (QID) | INTRAMUSCULAR | Status: DC | PRN
  Administered 2015-09-20 – 2015-09-21 (×2): 10 mg via INTRAVENOUS
  Filled 2015-09-20 (×2): qty 1

## 2015-09-20 MED ORDER — BISOPROLOL FUMARATE 5 MG PO TABS
5.0000 mg | ORAL_TABLET | Freq: Every day | ORAL | Status: DC
  Administered 2015-09-20 – 2015-09-23 (×4): 5 mg via ORAL
  Filled 2015-09-20 (×5): qty 1

## 2015-09-20 MED ORDER — ACETAMINOPHEN 500 MG PO TABS: 500.0000 mg | ORAL_TABLET | Freq: Four times a day (QID) | ORAL | Status: DC | PRN

## 2015-09-20 MED ORDER — PANTOPRAZOLE SODIUM 40 MG PO TBEC
40.0000 mg | DELAYED_RELEASE_TABLET | Freq: Every day | ORAL | Status: DC
  Administered 2015-09-20 – 2015-09-23 (×4): 40 mg via ORAL
  Filled 2015-09-20 (×4): qty 1

## 2015-09-20 MED ORDER — SODIUM CHLORIDE 0.9 % IV SOLN
INTRAVENOUS | Status: DC
  Administered 2015-09-20: 13:00:00 via INTRAVENOUS
  Administered 2015-09-21: 1000 mL via INTRAVENOUS
  Administered 2015-09-21: 05:00:00 via INTRAVENOUS
  Administered 2015-09-22: 1000 mL via INTRAVENOUS

## 2015-09-20 MED ORDER — PRAMOXINE HCL 1 % RE FOAM
Freq: Three times a day (TID) | RECTAL | Status: DC | PRN
  Filled 2015-09-20: qty 15

## 2015-09-20 MED ORDER — SODIUM CHLORIDE 0.9% FLUSH
3.0000 mL | Freq: Two times a day (BID) | INTRAVENOUS | Status: DC
  Administered 2015-09-21 – 2015-09-23 (×4): 3 mL via INTRAVENOUS

## 2015-09-20 MED ORDER — MORPHINE SULFATE (PF) 2 MG/ML IV SOLN: 1.0000 mg | INTRAVENOUS | Status: DC | PRN

## 2015-09-20 NOTE — ED Notes (Signed)
Meal ordered

## 2015-09-20 NOTE — ED Notes (Signed)
GI consult at bedside.

## 2015-09-20 NOTE — H&P (Signed)
History and Physical    [name] [id number] DOB: [birth year] DOA: 09/20/2015   PCP: Merrilee Seashore, MD   Patient coming from/Resides with: Private residence  Chief Complaint: Abdominal pain and rectal bleeding  HPI: [name] is a 82 y.o. female with medical history significant for history of CVA, known osteoporosis and chronic recurrent urinary incontinence, hypertension, diverticulosis, and GERD who presents to the ER with reports of acute onset of bright red blood after passage of bowel movement this morning. Patient has had decreased appetite and worsening of reflux and odynophagia and was scheduled to have a barium swallow study today by Dr. Amedeo Plenty with [facility]. She has chronic left-sided back and abdominal pain which was slightly worse than baseline 2 days ago but seems stable as of today. She's had no further bright red blood per rectum since arrival to the ER.  ED Course:  PO temp 97.8-BP 190/78-pulse 75 days respirations 27-pulse ox 97% Lab data: Sodium 133, potassium 3.3, chloride 95, BUN 14, creatinine 0.88, LFTs normal, WBC 7500 differential not obtained, hemoglobin 13.6, platelets 322,000 Medications and treatment: None  Review of Systems:  In addition to the HPI above,  No Fever-chills, myalgias or other constitutional symptoms No Headache, changes with Vision or hearing, new weakness, tingling, numbness in any extremity, No problems swallowing food or Liquids, indigestion/reflux No Chest pain, Cough or Shortness of Breath, palpitations, orthopnea or DOE No N/V; no melena, no dark tarry stools, has history of IBS with constipation alternating with diarrhea primarily with looser stools preceded by fecal urgency and need to completely evacuate bowels multiple bowel movements before able to get off toilet No dysuria, hematuria or flank pain No new skin rashes, lesions, masses or bruises, No new joints pains-aches No recent weight gain or loss No polyuria,  polydypsia or polyphagia,   Past Medical History  Diagnosis Date  . Heart valve disorder     "leaking"  . Hypertension   . Stroke [facility])     mild strokes  . Osteoporosis   . Cervical dysplasia   . Cystocele   . Rectocele   . Acid reflux   . Urinary incontinence     Past Surgical History  Procedure Laterality Date  . Total abdominal hysterectomy  1991    TAH, A/P REPAIR  . Cholecystectomy  2003  . Total hip arthroplasty      X2  . Inguinal hernia repair    . Appendectomy    . Prolapsed intestines    . Surgery for adhesions related to intestines    . Colposcopy    . Bladder suspension      Social History   Social History  . Marital Status: Divorced    Spouse Name: N/A  . Number of Children: N/A  . Years of Education: N/A   Occupational History  . Not on file.   Social History Main Topics  . Smoking status: Never Smoker   . Smokeless tobacco: Not on file  . Alcohol Use: 2.0 oz/week    4 drink(s) per week  . Drug Use: No  . Sexual Activity: No   Other Topics Concern  . Not on file   Social History Narrative    Mobility: Rolling walker Work history: Not applicable to current admission   Allergies  Allergen Reactions  . Aspirin     "full strength"  . Penicillins     Family History  Problem Relation Age of Onset  . Cancer Mother     COLON  .  Hypertension Father   . Heart disease Father   . Heart disease Sister   . Emphysema Brother      Prior to Admission medications   Medication Sig Start Date End Date Taking? Authorizing Provider  acetaminophen (TYLENOL) 500 MG tablet Take 500 mg by mouth every 6 (six) hours as needed for mild pain.    Historical Provider, MD  aspirin 81 MG tablet Take 81 mg by mouth every other day.     Historical Provider, MD  bisoprolol (ZEBETA) 5 MG tablet Take 5 mg by mouth daily.    Historical Provider, MD  guanFACINE (TENEX) 2 MG tablet Take 1 mg by mouth at bedtime.     Historical Provider, MD    HYDROcodone-acetaminophen (NORCO/VICODIN) 5-325 MG tablet Take 0.5 tablets by mouth every 8 (eight) hours as needed for severe pain. 05/14/15   Quintella Reichert, MD  isradipine [facility]) 5 MG capsule Take 5 mg by mouth daily.    Historical Provider, MD  losartan (COZAAR) 100 MG tablet Take 100 mg by mouth daily.    Historical Provider, MD  omeprazole (PRILOSEC) 20 MG capsule Take 20 mg by mouth daily.    Historical Provider, MD  Polyvinyl Alcohol-Povidone (REFRESH OP) Place 1 drop into both eyes daily. Take every day per patient    Historical Provider, MD  potassium chloride 20 MEQ TBCR Take 20 mEq by mouth daily. 05/14/15   Quintella Reichert, MD    Physical Exam: Filed Vitals:   09/20/15 0900 09/20/15 0915 09/20/15 0930 09/20/15 0945  BP: 190/78  196/90 203/82  Pulse: 75 66 65 66  Temp:      TempSrc:      Resp: 27 23 19 20   SpO2: 97% 95% 98% 97%      Constitutional: NAD, calm, comfortableAlthough somewhat pale in appearance Eyes: PERRL, lids and conjunctivae normal ENMT: Mucous membranes are moist. Posterior pharynx clear of any exudate or lesions.Normal dentition.  Neck: normal, supple, no masses, no thyromegaly Respiratory: clear to auscultation bilaterally, no wheezing, no crackles. Normal respiratory effort. No accessory muscle use.  Cardiovascular: Regular rate and rhythm, soft grade 1/6 systolic murmur right sternal border second costal space, no rubs / gallops. No extremity edema. 2+ pedal pulses. No carotid bruits.  Abdomen: Somewhat tender left lower quadrant, no masses palpated. No hepatosplenomegaly. Bowel sounds positive.  Musculoskeletal: no clubbing / cyanosis. No joint deformity upper and lower extremities. Good ROM, no contractures. Normal muscle tone.  Skin: no rashes, lesions, ulcers. No induration Neurologic: CN 2-12 grossly intact. Sensation intact, DTR normal. Strength 5/5 x all 4 extremities.  Psychiatric: Normal judgment and insight. Alert and oriented x 3. Normal  mood.    Labs on Admission: I have personally reviewed following labs and imaging studies  CBC:  Recent Labs Lab 09/20/15 0920  WBC 7.5  HGB 13.6  HCT 39.1  MCV 88.7  PLT [id number]   Basic Metabolic Panel:  Recent Labs Lab 09/20/15 0920  NA 133*  K 3.3*  CL 95*  CO2 29  GLUCOSE 90  BUN 14  CREATININE 0.88  CALCIUM 9.0   GFR: CrCl cannot be calculated (Unknown ideal weight.). Liver Function Tests:  Recent Labs Lab 09/20/15 0920  AST 31  ALT 12*  ALKPHOS 81  BILITOT 1.0  PROT 7.0  ALBUMIN 3.5   No results for input(s): LIPASE, AMYLASE in the last 168 hours. No results for input(s): AMMONIA in the last 168 hours. Coagulation Profile: No results for input(s): INR, PROTIME in  the last 168 hours. Cardiac Enzymes: No results for input(s): CKTOTAL, CKMB, CKMBINDEX, TROPONINI in the last 168 hours. BNP (last 3 results) No results for input(s): PROBNP in the last 8760 hours. HbA1C: No results for input(s): HGBA1C in the last 72 hours. CBG: No results for input(s): GLUCAP in the last 168 hours. Lipid Profile: No results for input(s): CHOL, HDL, LDLCALC, TRIG, CHOLHDL, LDLDIRECT in the last 72 hours. Thyroid Function Tests: No results for input(s): TSH, T4TOTAL, FREET4, T3FREE, THYROIDAB in the last 72 hours. Anemia Panel: No results for input(s): VITAMINB12, FOLATE, FERRITIN, TIBC, IRON, RETICCTPCT in the last 72 hours. Urine analysis:    Component Value Date/Time   COLORURINE YELLOW 05/14/2015 1950   APPEARANCEUR CLEAR 05/14/2015 1950   LABSPEC 1.009 05/14/2015 1950   PHURINE 6.5 05/14/2015 1950   GLUCOSEU NEGATIVE 05/14/2015 1950   HGBUR TRACE* 05/14/2015 1950   BILIRUBINUR NEGATIVE 05/14/2015 1950   KETONESUR 15* 05/14/2015 1950   PROTEINUR 30* 05/14/2015 1950   UROBILINOGEN 0.2 01/16/2015 0931   NITRITE NEGATIVE 05/14/2015 1950   LEUKOCYTESUR NEGATIVE 05/14/2015 1950   Sepsis Labs: @LABRCNTIP (procalcitonin:4,lacticidven:4) )No results found for this  or any previous visit (from the past 240 hour(s)).   Radiological Exams on Admission: No results found.   Assessment/Plan Principal Problem:   Acute lower GI bleeding/  LLQ abdominal pain/Diverticulosis of colon -Patient presents with one episode of bright red blood per rectum associated with slight worsening of left lower abdominal pain and known diverticular disease; was also found to have an internal hemorrhoid by ER staff that did not appear to be actively bleeding -Proctofoam TID -Mother with history of colon cancer and daughter with colon polyps -Discussed with Dr. Pamalee Leyden GI who recommended conservative care for this elderly female patient stating she is likely not a candidate for any type of endoscopy unless she continues to have GI bleeding symptoms; he states to call him back on 6/30 if she continues to have active bleeding -Hemoglobin stable without anemia-repeat CBC in a.m. -Holding home baby aspirin -Clear liquid diet until a.m. and if no further bleeding can advance diet  Active Problems:   GERD (gastroesophageal reflux disease) -Patient reports worsening issues with reflux and odynophagia therefore rationale for outpatient barium swallow study ordered for today which patient did not accomplish prior to presenting to ER -Patient reports due to concerns over side effects from PPIs she has decreased her Prilosec to QOD dosing-will continue oral Protonix daily while here -Per my discussion with Dr. Michail Sermon patient can defer undergoing barium swallow study as an outpatient; for patient convenience can schedule barium swallow/esophagram prior to discharge    Moderate tricuspid regurgitation -Subtle murmur noted on physical exam    Hypertension -On multiple medications prior to admission which will be continued during this admission: Bisoprolol, isradipine, Cozaar and Guanfacine -prn IV hydralazine for systolic blood pressure greater than 180    Chronic  hyponatremia -Stable and around baseline between 129 and 133    History of CVA (cerebrovascular accident) -No apparent focal residual deficits -Patient reports fall several months ago and was subsequently started using her rolling walker regularly    Osteoporosis -No longer on disease modifying medications    Urinary incontinence (chronic) -For completeness of exam an elderly patient who may be asymptomatic with check urinalysis and culture      DVT prophylaxis: SCDs  Code Status: DO NOT RESUSCITATE  Family Communication: Daughter at bedside Disposition Plan: Anticipate discharge back to preadmission home environment once medically stable Consults called:  Telephone consultation (as above) with Dr. Schooler/gastroenterology Admission status: Observation/telemetry     Samella Parr ANP-BC [facility] Pager [phone]   If 7PM-7AM, please contact night-coverage [email or web address] Password [facility]  09/20/2015, 12:22 PM

## 2015-09-20 NOTE — ED Notes (Signed)
Pt. States she is supposed to have a barium study today and when she went to the bathroom this AM, she noted blood. Pt. States she had new onset of  Back pain this AM and nausea, or cramping or pain when going tot he bathroom. Pt. Has noted a decreased appetite over the last few weeks.

## 2015-09-20 NOTE — ED Notes (Signed)
GI specialist

## 2015-09-20 NOTE — ED Provider Notes (Signed)
CSN: [id number]     Arrival date & time 09/20/15  0830 History   First MD Initiated Contact with Patient 09/20/15 [phone]     Chief Complaint  Patient presents with  . GI Bleeding     (Consider location/radiation/quality/duration/timing/severity/associated sxs/prior Treatment) HPI Comments: Patient is a 82 year old female with history of diverticulitis and hemorrhoids who presents with GI bleeding. Patient woke up this morning with the urge to use the restroom and passed a large amount of bright red blood. Patient states it felt like "a miscarriage." Patient has no recent history of GI bleeding. Patient has had hemorrhoidectomy in the past and has had scant bleeding, but never this much. Patient has never had bleeding from her diverticulitis. Patient has mild left lower quadrant pain, which she states she has from time to time.. Patient has had chronic diarrhea for the past month, as well as some intermittent nausea and decreased appetite. She has also had an unintentional 5 pound weight loss. Patient is not on blood thinners. Patient was scheduled for a barium swallow this morning due to difficulty swallowing recently. Patient denies any chest pain, shortness of breath, vomiting, dysuria.  The history is provided by the patient.    Past Medical History  Diagnosis Date  . Heart valve disorder     "leaking"  . Hypertension   . Stroke [facility])     mild strokes  . Osteoporosis   . Cervical dysplasia   . Cystocele   . Rectocele   . Acid reflux   . Urinary incontinence    Past Surgical History  Procedure Laterality Date  . Total abdominal hysterectomy  1991    TAH, A/P REPAIR  . Cholecystectomy  2003  . Total hip arthroplasty      X2  . Inguinal hernia repair    . Appendectomy    . Prolapsed intestines    . Surgery for adhesions related to intestines    . Colposcopy    . Bladder suspension     Family History  Problem Relation Age of Onset  . Cancer Mother     COLON  . Hypertension  Father   . Heart disease Father   . Heart disease Sister   . Emphysema Brother    Social History  Substance Use Topics  . Smoking status: Never Smoker   . Smokeless tobacco: None  . Alcohol Use: 2.0 oz/week    4 drink(s) per week   OB History    Gravida Para Term Preterm AB TAB SAB Ectopic Multiple Living   6 2 2  4     2      Review of Systems  Constitutional: Positive for unexpected weight change. Negative for fever and chills.  HENT: Negative for facial swelling and sore throat.   Respiratory: Negative for shortness of breath.   Cardiovascular: Negative for chest pain.  Gastrointestinal: Positive for nausea, abdominal pain, diarrhea and blood in stool. Negative for vomiting.  Genitourinary: Negative for dysuria.  Musculoskeletal: Negative for back pain.  Skin: Negative for rash and wound.  Neurological: Negative for headaches.  Psychiatric/Behavioral: The patient is not nervous/anxious.       Allergies  Aspirin; Penicillins; and Quinine derivatives  Home Medications   Prior to Admission medications   Medication Sig Start Date End Date Taking? Authorizing Provider  acetaminophen (TYLENOL) 500 MG tablet Take 500 mg by mouth every 6 (six) hours as needed for mild pain.    Historical Provider, MD  aspirin 81 MG tablet Take  81 mg by mouth every other day.     Historical Provider, MD  bisoprolol (ZEBETA) 5 MG tablet Take 5 mg by mouth daily.    Historical Provider, MD  guanFACINE (TENEX) 2 MG tablet Take 1 mg by mouth at bedtime.     Historical Provider, MD  HYDROcodone-acetaminophen (NORCO/VICODIN) 5-325 MG tablet Take 0.5 tablets by mouth every 8 (eight) hours as needed for severe pain. 05/14/15   Quintella Reichert, MD  isradipine [facility]) 5 MG capsule Take 5 mg by mouth daily.    Historical Provider, MD  losartan (COZAAR) 100 MG tablet Take 100 mg by mouth daily.    Historical Provider, MD  omeprazole (PRILOSEC) 20 MG capsule Take 20 mg by mouth daily.    Historical  Provider, MD  Polyvinyl Alcohol-Povidone (REFRESH OP) Place 1 drop into both eyes daily. Take every day per patient    Historical Provider, MD  potassium chloride 20 MEQ TBCR Take 20 mEq by mouth daily. 05/14/15   Quintella Reichert, MD   BP 203/82 mmHg  Pulse 66  Temp(Src) 97.8 F (36.6 C) (Oral)  Resp 20  SpO2 97% Physical Exam  Constitutional: She appears well-developed and well-nourished. No distress.  HENT:  Head: Normocephalic and atraumatic.  Mouth/Throat: Oropharynx is clear and moist. No oropharyngeal exudate.  Eyes: Conjunctivae are normal. Pupils are equal, round, and reactive to light. Right eye exhibits no discharge. Left eye exhibits no discharge. No scleral icterus.  Neck: Normal range of motion. Neck supple. No thyromegaly present.  Cardiovascular: Normal rate, regular rhythm, normal heart sounds and intact distal pulses.  Exam reveals no gallop and no friction rub.   No murmur heard. Pulmonary/Chest: Effort normal and breath sounds normal. No stridor. No respiratory distress. She has no wheezes. She has no rales.  Abdominal: Soft. Bowel sounds are normal. She exhibits no distension. There is no tenderness. There is no rebound and no guarding.    Genitourinary: Rectal exam shows internal hemorrhoid. Guaiac positive stool.  Blood surrounding anus; gross bright red blood on exam; 1 large internal hemorrhoid intact  Musculoskeletal: She exhibits no edema.  Lymphadenopathy:    She has no cervical adenopathy.  Neurological: She is alert. Coordination normal.  Skin: Skin is warm and dry. No rash noted. She is not diaphoretic. No pallor.  Psychiatric: She has a normal mood and affect.  Nursing note and vitals reviewed.   ED Course  Procedures (including critical care time) Labs Review Labs Reviewed  COMPREHENSIVE METABOLIC PANEL - Abnormal; Notable for the following:    Sodium 133 (*)    Potassium 3.3 (*)    Chloride 95 (*)    ALT 12 (*)    GFR calc non Af Amer 55 (*)      All other components within normal limits  URINE CULTURE  CBC  URINALYSIS, ROUTINE W REFLEX MICROSCOPIC (NOT AT [facility])  POC OCCULT BLOOD, ED  TYPE AND SCREEN  ABO/RH    Imaging Review No results found. I have personally reviewed and evaluated these images and lab results as part of my medical decision-making.   EKG Interpretation None      MDM   CBC unremarkable. CMP shows sodium 133, potassium 3.3, chloride 95, ALT 12. Blood type O+, antibody negative.Due to patient's age and basic symptoms concerning for malignancy, I consulted hospitalist service and spoke with Debbora Lacrosse, NP who will admit the patient for observation and further evaluation and treatment. Bryson Ha will consult GI to make them aware of the  patient's status and to arrange barium study if needed while in the hospital.   Final diagnoses:  Acute GI bleeding       Frederica Kuster, PA-C 09/20/15 [place], MD 09/20/15 [phone]

## 2015-09-21 DIAGNOSIS — K921 Melena: Secondary | ICD-10-CM | POA: Diagnosis not present

## 2015-09-21 DIAGNOSIS — E876 Hypokalemia: Secondary | ICD-10-CM

## 2015-09-21 DIAGNOSIS — K922 Gastrointestinal hemorrhage, unspecified: Secondary | ICD-10-CM | POA: Diagnosis not present

## 2015-09-21 LAB — BASIC METABOLIC PANEL
ANION GAP: 10 (ref 5–15)
BUN: 8 mg/dL (ref 6–20)
CALCIUM: 8.4 mg/dL — AB (ref 8.9–10.3)
CO2: 27 mmol/L (ref 22–32)
Chloride: 94 mmol/L — ABNORMAL LOW (ref 101–111)
Creatinine, Ser: 0.83 mg/dL (ref 0.44–1.00)
GFR, EST NON AFRICAN AMERICAN: 59 mL/min — AB (ref >60)
GLUCOSE: 89 mg/dL (ref 65–99)
POTASSIUM: 2.6 mmol/L — AB (ref 3.5–5.1)
Sodium: 131 mmol/L — ABNORMAL LOW (ref 135–145)

## 2015-09-21 LAB — URINE CULTURE: Culture: NO GROWTH

## 2015-09-21 LAB — CBC
HCT: 38.6 % (ref 36.0–46.0)
Hemoglobin: 13 g/dL (ref 12.0–15.0)
MCH: 29.9 pg (ref 26.0–34.0)
MCHC: 33.7 g/dL (ref 30.0–36.0)
MCV: 88.7 fL (ref 78.0–100.0)
PLATELETS: 343 10*3/uL (ref 150–400)
RBC: 4.35 MIL/uL (ref 3.87–5.11)
RDW: 13.6 % (ref 11.5–15.5)
WBC: 9.2 10*3/uL (ref 4.0–10.5)

## 2015-09-21 LAB — MAGNESIUM: MAGNESIUM: 1.3 mg/dL — AB (ref 1.7–2.4)

## 2015-09-21 MED ORDER — MAGNESIUM SULFATE 4 GM/100ML IV SOLN
4.0000 g | Freq: Once | INTRAVENOUS | Status: AC
  Administered 2015-09-21: 4 g via INTRAVENOUS
  Filled 2015-09-21: qty 100

## 2015-09-21 MED ORDER — BOOST / RESOURCE BREEZE PO LIQD
1.0000 | Freq: Three times a day (TID) | ORAL | Status: DC
Start: 2015-09-21 — End: 2015-09-23
  Administered 2015-09-21 – 2015-09-23 (×7): 1 via ORAL

## 2015-09-21 MED ORDER — POTASSIUM CHLORIDE CRYS ER 20 MEQ PO TBCR
40.0000 meq | EXTENDED_RELEASE_TABLET | ORAL | Status: AC
  Administered 2015-09-21 (×3): 40 meq via ORAL
  Filled 2015-09-21 (×3): qty 2

## 2015-09-21 NOTE — Progress Notes (Signed)
Initial Nutrition Assessment  DOCUMENTATION CODES:   Not applicable  INTERVENTION:   -Boost Breeze po TID, each supplement provides 250 kcal and 9 grams of protein  NUTRITION DIAGNOSIS:   Inadequate oral intake related to altered GI function as evidenced by per patient/family report.  GOAL:   Patient will meet greater than or equal to 90% of their needs  MONITOR:   PO intake, Supplement acceptance, Diet advancement, Labs, Weight trends, Skin, I & O's  REASON FOR ASSESSMENT:   Malnutrition Screening Tool    ASSESSMENT:   [name] is a 82 y.o. female with medical history significant for history of CVA, known osteoporosis and chronic recurrent urinary incontinence, hypertension, diverticulosis, and GERD who presents to the ER with reports of acute onset of bright red blood after passage of bowel movement this morning. Patient has had decreased appetite and worsening of reflux and odynophagia and was scheduled to have a barium swallow study today by Dr. Amedeo Plenty with [facility]. She has chronic left-sided back and abdominal pain which was slightly worse than baseline 2 days ago but seems stable as of today. She's had no further bright red blood per rectum since arrival to the ER.  Pt admitted with acute lower GIB.   Hx obtained from pt at pt daughter at bedside. Both report poor appetite over the past few weeks to few months. Pt resides in an independent living facility and has access to 3 meals per day in the dining room. Pt generally consumes cereal or oatmeal for breakfast, meat/starch/vegetable for lunch, and sandwich for dinner. Pt shares that she will often consume her leftover meat and roll from lunch for dinner.   Pt reports that she struggled with her weight as a child and has been very watchful of her diet throughout adulthood. Pt daughter estimates she has lost about 5-6 pounds over the past few months. Per wt hx, weight changes not significant for time frame.    Nutrition-Focused physical exam completed. Findings are mild fat depletion, mild muscle depletion, and no edema. Pt is very small framed. Suspect muscle depletion is related to advanced age.   [name] reports that pt has always been concerned about her weight and suspects that pt limits her diet to stay thin. Pt is willing to take supplements but does not take milk products well. Amenable to [employer]. Pt tolerated clear liquid diet well this morning. Discussed importance of good intake to promote healing.   Labs reviewed: Na: 133 (on IV supplementation), K: 3.3 (on PO supplementation).   Diet Order:  Diet full liquid Room service appropriate?: Yes; Fluid consistency:: Thin  Skin:  Reviewed, no issues  Last BM:  09/21/15  Height:   Ht Readings from Last 1 Encounters:  09/21/15 4\' 8"  (1.422 m)    Weight:   Wt Readings from Last 1 Encounters:  09/21/15 94 lb 2.2 oz (42.7 kg)    Ideal Body Weight:  42.3 kg  BMI:  Body mass index is 21.12 kg/(m^2).  Estimated Nutritional Needs:   Kcal:  1100-1300  Protein:  45-55 grams  Fluid:  > 1 L  EDUCATION NEEDS:   Education needs addressed  Careen Mauch A. Jimmye Norman, RD, LDN, CDE Pager: [phone] After hours Pager: [phone]

## 2015-09-21 NOTE — Progress Notes (Signed)
Patient/Family oriented to room. Information packet given to patient/family. Admission inpatient armband information verified with patient/family to include name and date of birth and placed on patient arm. Side rails up x 2, fall assessment and education completed with patient/family. Call light within reach. Patient/family able to voice and demonstrate understanding of unit orientation instructions  

## 2015-09-21 NOTE — Care Management Obs Status (Signed)
[place] NOTIFICATION   Patient Details  Name: [name] MRN: [id number] Date of Birth: 1919/11/18   Medicare Observation Status Notification Given:  Yes    Pollie Friar, RN 09/21/2015, 3:54 PM

## 2015-09-21 NOTE — Progress Notes (Addendum)
Notified Dr.Thompson of critical potassium of 2.6.    0735- spoke with Dr.Thompson and the patient is already scheduled to take a potassium supplement this morning.

## 2015-09-21 NOTE — Progress Notes (Signed)
PROGRESS NOTE    [name]  [id number] DOB: 22-Mar-1920 DOA: 09/20/2015 PCP: Merrilee Seashore, MD   Assessment & Plan:   Principal Problem:   Acute lower GI bleeding Active Problems:   Moderate tricuspid regurgitation   Hypertension   History of CVA (cerebrovascular accident)   Osteoporosis   GERD (gastroesophageal reflux disease)   Urinary incontinence   LLQ abdominal pain   Chronic hyponatremia   Diverticulosis of colon   Hypokalemia   Hypomagnesemia  #1 acute lower GI bleed Likely secondary to hemorrhoids. Patient with no further bleeding. Hemoglobin has remained stable. Patient tolerating clear liquids. Case was discussed with GI who felt this was likely self-limiting and if patient with persistent bleeding with a drop in the hemoglobin will likely need a formal GI consult at that time. Will follow H&H.  #2 hypokalemia/hypomagnesemia Likely secondary to problem #1. Replete. Keep magnesium greater than 2.  #3 gastroesophageal reflux disease PPI.  #4 chronic hyponatremia Stable.  #5 history of CVA Aspirin on hold.  #6 hypertension Continue Zebeta and Cozaar, isradapine.   DVT prophylaxis SCDs Code Status: DO NOT RESUSCITATE Family Communication: Updated patient and daughter at bedside. Disposition Plan: Back to assisted living facility once GI bleed has resolved and patient stable.   Consultants:   None  Procedures:   None  Antimicrobials:   None   Subjective: Patient denies any chest pain. No shortness of breath. Per nurse tech patient with no further bloody bowel movements. Patient tolerating clear liquids.  Objective: Filed Vitals:   09/20/15 2032 09/21/15 0512 09/21/15 0621 09/21/15 0627  BP: 188/81 214/75 141/57   Pulse: 71 64 70   Temp: 98.3 F (36.8 C) 98 F (36.7 C)    TempSrc: Oral Oral    Resp: 16 22 19    Height:    4\' 8"  (1.422 m)  Weight:    42.7 kg (94 lb 2.2 oz)  SpO2: 95% 94%      Intake/Output Summary (Last 24  hours) at 09/21/15 1154 Last data filed at 09/21/15 0959  Gross per 24 hour  Intake 1190.83 ml  Output    400 ml  Net 790.83 ml   Filed Weights   09/21/15 0627  Weight: 42.7 kg (94 lb 2.2 oz)    Examination:  General exam: Appears calm and comfortable  Respiratory system: Clear to auscultation. Respiratory effort normal. Cardiovascular system: S1 & S2 heard, RRR. No JVD, murmurs, rubs, gallops or clicks. No pedal edema. Gastrointestinal system: Abdomen is nondistended, soft and nontender. No organomegaly or masses felt. Normal bowel sounds heard. Central nervous system: Alert and oriented. No focal neurological deficits. Extremities: Symmetric 5 x 5 power. Skin: No rashes, lesions or ulcers Psychiatry: Judgement and insight appear normal. Mood & affect appropriate.     Data Reviewed: I have personally reviewed following labs and imaging studies  CBC:  Recent Labs Lab 09/20/15 0920 09/21/15 0601  WBC 7.5 9.2  HGB 13.6 13.0  HCT 39.1 38.6  MCV 88.7 88.7  PLT 322 [id number]   Basic Metabolic Panel:  Recent Labs Lab 09/20/15 0920 09/21/15 0601 09/21/15 0900  NA 133* 131*  --   K 3.3* 2.6*  --   CL 95* 94*  --   CO2 29 27  --   GLUCOSE 90 89  --   BUN 14 8  --   CREATININE 0.88 0.83  --   CALCIUM 9.0 8.4*  --   MG  --   --  1.3*  GFR: Estimated Creatinine Clearance: 24.3 mL/min (by C-G formula based on Cr of 0.83). Liver Function Tests:  Recent Labs Lab 09/20/15 0920  AST 31  ALT 12*  ALKPHOS 81  BILITOT 1.0  PROT 7.0  ALBUMIN 3.5   No results for input(s): LIPASE, AMYLASE in the last 168 hours. No results for input(s): AMMONIA in the last 168 hours. Coagulation Profile: No results for input(s): INR, PROTIME in the last 168 hours. Cardiac Enzymes: No results for input(s): CKTOTAL, CKMB, CKMBINDEX, TROPONINI in the last 168 hours. BNP (last 3 results) No results for input(s): PROBNP in the last 8760 hours. HbA1C: No results for input(s): HGBA1C in  the last 72 hours. CBG: No results for input(s): GLUCAP in the last 168 hours. Lipid Profile: No results for input(s): CHOL, HDL, LDLCALC, TRIG, CHOLHDL, LDLDIRECT in the last 72 hours. Thyroid Function Tests: No results for input(s): TSH, T4TOTAL, FREET4, T3FREE, THYROIDAB in the last 72 hours. Anemia Panel: No results for input(s): VITAMINB12, FOLATE, FERRITIN, TIBC, IRON, RETICCTPCT in the last 72 hours. Sepsis Labs: No results for input(s): PROCALCITON, LATICACIDVEN in the last 168 hours.  No results found for this or any previous visit (from the past 240 hour(s)).       Radiology Studies: No results found.      Scheduled Meds: . bisoprolol  5 mg Oral Daily  . guanFACINE  1 mg Oral QHS  . isradipine  5 mg Oral Daily  . losartan  100 mg Oral Daily  . magnesium sulfate 1 - 4 g bolus IVPB  4 g Intravenous Once  . pantoprazole  40 mg Oral Daily  . potassium chloride  40 mEq Oral Q4H  . sodium chloride flush  3 mL Intravenous Q12H   Continuous Infusions: . sodium chloride 75 mL/hr at 09/21/15 0730        Time spent: 35 minutes    THOMPSON,DANIEL, MD [facility] Pager [phone]  If 7PM-7AM, please contact night-coverage [email or web address] Password [email or web address] 09/21/2015, 11:54 AM

## 2015-09-22 DIAGNOSIS — K922 Gastrointestinal hemorrhage, unspecified: Secondary | ICD-10-CM | POA: Diagnosis not present

## 2015-09-22 DIAGNOSIS — Z8673 Personal history of transient ischemic attack (TIA), and cerebral infarction without residual deficits: Secondary | ICD-10-CM | POA: Diagnosis not present

## 2015-09-22 DIAGNOSIS — E871 Hypo-osmolality and hyponatremia: Secondary | ICD-10-CM

## 2015-09-22 DIAGNOSIS — K219 Gastro-esophageal reflux disease without esophagitis: Secondary | ICD-10-CM

## 2015-09-22 DIAGNOSIS — K921 Melena: Secondary | ICD-10-CM | POA: Diagnosis not present

## 2015-09-22 DIAGNOSIS — I10 Essential (primary) hypertension: Secondary | ICD-10-CM

## 2015-09-22 LAB — BASIC METABOLIC PANEL
Anion gap: 7 (ref 5–15)
BUN: 10 mg/dL (ref 6–20)
CALCIUM: 8.2 mg/dL — AB (ref 8.9–10.3)
CHLORIDE: 103 mmol/L (ref 101–111)
CO2: 22 mmol/L (ref 22–32)
CREATININE: 0.95 mg/dL (ref 0.44–1.00)
GFR, EST AFRICAN AMERICAN: 58 mL/min — AB (ref >60)
GFR, EST NON AFRICAN AMERICAN: 50 mL/min — AB (ref >60)
Glucose, Bld: 116 mg/dL — ABNORMAL HIGH (ref 65–99)
Potassium: 4 mmol/L (ref 3.5–5.1)
SODIUM: 132 mmol/L — AB (ref 135–145)

## 2015-09-22 LAB — CBC
HCT: 36.7 % (ref 36.0–46.0)
Hemoglobin: 12.3 g/dL (ref 12.0–15.0)
MCH: 30 pg (ref 26.0–34.0)
MCHC: 33.5 g/dL (ref 30.0–36.0)
MCV: 89.5 fL (ref 78.0–100.0)
PLATELETS: 274 10*3/uL (ref 150–400)
RBC: 4.1 MIL/uL (ref 3.87–5.11)
RDW: 14 % (ref 11.5–15.5)
WBC: 9.4 10*3/uL (ref 4.0–10.5)

## 2015-09-22 LAB — MAGNESIUM: MAGNESIUM: 2.2 mg/dL (ref 1.7–2.4)

## 2015-09-22 MED ORDER — HYDRALAZINE HCL 20 MG/ML IJ SOLN
5.0000 mg | Freq: Four times a day (QID) | INTRAMUSCULAR | Status: DC | PRN
  Administered 2015-09-22: 5 mg via INTRAVENOUS
  Filled 2015-09-22: qty 1

## 2015-09-22 MED ORDER — HYDROCODONE-ACETAMINOPHEN 5-325 MG PO TABS
0.5000 | ORAL_TABLET | Freq: Three times a day (TID) | ORAL | Status: DC | PRN
  Administered 2015-09-22: 0.5 via ORAL
  Filled 2015-09-22: qty 1

## 2015-09-22 MED ORDER — ACETAMINOPHEN 500 MG PO TABS
500.0000 mg | ORAL_TABLET | Freq: Three times a day (TID) | ORAL | Status: DC
  Administered 2015-09-22 – 2015-09-23 (×2): 500 mg via ORAL
  Filled 2015-09-22 (×2): qty 1

## 2015-09-22 MED ORDER — ASPIRIN 81 MG PO TABS: 81.0000 mg | ORAL_TABLET | ORAL | Status: DC

## 2015-09-22 MED ORDER — GABAPENTIN 250 MG/5ML PO SOLN
200.0000 mg | Freq: Three times a day (TID) | ORAL | Status: DC
  Administered 2015-09-22 – 2015-09-23 (×3): 200 mg via ORAL
  Filled 2015-09-22 (×5): qty 4

## 2015-09-22 MED ORDER — BOOST / RESOURCE BREEZE PO LIQD: 1.0000 | Freq: Three times a day (TID) | ORAL | Status: DC

## 2015-09-22 MED ORDER — AMLODIPINE BESYLATE 5 MG PO TABS
5.0000 mg | ORAL_TABLET | Freq: Every day | ORAL | Status: DC
  Administered 2015-09-22 – 2015-09-23 (×2): 5 mg via ORAL
  Filled 2015-09-22 (×2): qty 1

## 2015-09-22 MED ORDER — AMLODIPINE BESYLATE 5 MG PO TABS: 5.0000 mg | ORAL_TABLET | Freq: Every day | ORAL | Status: DC

## 2015-09-22 MED ORDER — PRAMOXINE HCL 1 % RE FOAM: Freq: Three times a day (TID) | RECTAL | Status: DC | PRN

## 2015-09-22 MED ORDER — OMEPRAZOLE 20 MG PO CPDR: 20.0000 mg | DELAYED_RELEASE_CAPSULE | Freq: Every day | ORAL | Status: DC

## 2015-09-22 NOTE — Discharge Summary (Addendum)
Physician Discharge Summary  [name] [id number] DOB: Sep 26, 1919 DOA: 09/20/2015  PCP: [name]  Admit date: 09/20/2015 Discharge date: 09/22/2015  Time spent: 65 minutes  Recommendations for Outpatient Follow-up:  1. Follow-up with [facility], [name] in 1 week. On follow-up patient's blood pressure needs to be reassessed. Patient will need a CBC done to follow-up on a hemoglobin. Patient will need a basic metabolic profile done as well as a magnesium level checked to follow-up on electrolytes and renal function. 2. Follow-up with Donna. Amedeo [name] of gastroenterology as previously scheduled.   Discharge Diagnoses:  Principal Problem:   Acute lower [name] bleeding Active Problems:   Moderate tricuspid regurgitation   Hypertension   History of CVA (cerebrovascular accident)   Osteoporosis   GERD (gastroesophageal reflux disease)   Urinary incontinence   LLQ abdominal pain   Chronic hyponatremia   Diverticulosis of colon   Hypokalemia   Hypomagnesemia   Discharge Condition: Stable and improved  Diet recommendation: Soft diet.  Filed Weights   09/21/15 [id number]  Weight: 42.7 kg (94 lb 2.2 oz)    History of present illness:  Per Donna [name] is a 82 y.o. female with medical history significant for history of CVA, known osteoporosis and chronic recurrent urinary incontinence, hypertension, diverticulosis, and GERD who presented to the ER with reports of acute onset of bright red blood after passage of bowel movement this morning. Patient has had decreased appetite and worsening of reflux and odynophagia and was scheduled to have a barium swallow study today by Donna. Amedeo [name] with [name]. She has chronic left-sided back and abdominal pain which was slightly worse than baseline 2 days ago but seems stable as of today. She's had no further bright red blood per rectum since arrival to the ER.  ED Course:  PO temp 97.8-BP 190/78-pulse 75 days respirations 27-pulse ox  97% Lab data: Sodium 133, potassium 3.3, chloride 95, BUN 14, creatinine 0.88, LFTs normal, WBC 7500 differential not obtained, hemoglobin 13.6, platelets 322,000   [name] Course:  #1 acute lower [name] bleed Likely secondary to hemorrhoids. Patient had presented with some blood in her stools which was felt to be likely hemorrhoidal. Patient was admitted under observation. Patient had no further bleeding during the hospitalization hemoglobin remained stable. Patient subsequently started on clear liquids and diet advanced to a soft diet which she tolerated.  Case was discussed with [name] who felt this was likely self-limiting and if patient with persistent bleeding with a drop in the hemoglobin will likely need a formal [name] consult at that time. Patient's hemoglobin remained stable did not have any further bleeding and patient be discharged home in stable and improved condition. Outpatient follow-up with [name] as previously scheduled.   #2 hypokalemia/hypomagnesemia Likely secondary to problem #1. Repleted. Keep magnesium greater than 2.  #3 gastroesophageal reflux disease PPI.  #4 chronic hyponatremia Stable.  #5 history of CVA Aspirin was held during the hospitalization and may be resumed 1 weeks post discharge.   #6 hypertension Continued on home regimen of Zebeta and Cozaar, isradapine.  Procedures:  None  Consultations:  None  Discharge Exam: Filed Vitals:   09/22/15 0844 09/22/15 1028  BP: 184/59 183/61  Pulse: 55 55  Temp: 98.6 F (37 C) 97.6 F (36.4 C)  Resp: 18 20    General: NAD Cardiovascular: RRR Respiratory: CTAB  Discharge Instructions   Discharge Instructions    Diet general    Complete by:  As directed   Soft diet  Discharge instructions    Complete by:  As directed   Follow up with [name] in 1 week.     Increase activity slowly    Complete by:  As directed           Current Discharge Medication List    START taking these  medications   Details  feeding supplement (BOOST / RESOURCE BREEZE) LIQD Take 1 Container by mouth 3 (three) times daily between meals. Refills: 0    pramoxine (PROCTOFOAM) 1 % foam Place rectally 3 (three) times daily as needed for itching. Qty: 15 g, Refills: 0      CONTINUE these medications which have CHANGED   Details  aspirin 81 MG tablet Take 1 tablet (81 mg total) by mouth See admin instructions. Every 3 days Qty: 30 tablet    omeprazole (PRILOSEC) 20 MG capsule Take 1 capsule (20 mg total) by mouth daily. Qty: 30 capsule, Refills: 0      CONTINUE these medications which have NOT CHANGED   Details  acetaminophen (TYLENOL) 500 MG tablet Take 500 mg by mouth every 6 (six) hours as needed for mild pain.    bisoprolol (ZEBETA) 5 MG tablet Take 5 mg by mouth daily.    guanFACINE (TENEX) 2 MG tablet Take 1 mg by mouth at bedtime.     HYDROcodone-acetaminophen (NORCO/VICODIN) 5-325 MG tablet Take 0.5 tablets by mouth every 8 (eight) hours as needed for severe pain. Qty: 12 tablet, Refills: 0    isradipine (DYNACIRC) 5 MG capsule Take 5 mg by mouth daily.    losartan (COZAAR) 100 MG tablet Take 100 mg by mouth daily.    Polyvinyl Alcohol-Povidone (REFRESH OP) Place 1 drop into both eyes daily. Take every day per patient       Allergies  Allergen Reactions  . Aspirin     "full strength"  . Penicillins     Has patient had a PCN reaction causing immediate rash, facial/tongue/throat swelling, SOB or lightheadedness with hypotension: no Has patient had a PCN reaction causing severe rash involving mucus membranes or skin necrosis: no Has patient had a PCN reaction that required hospitalization no Has patient had a PCN reaction occurring within the last 10 years: no If all of the above answers are "NO", then may proceed with Cephalosporin   . Quinine Derivatives    Follow-up Information    Follow up with [facility], [name]. Schedule an appointment as soon as possible  for a visit in 1 week.   Specialty:  Internal Medicine   Contact information:   [place] [name] [address] [phone]       Follow up with Donna [facility], [name].   Specialty:  Gastroenterology   Why:  f/u as scheduled.   Contact information:   [address] [place] [address] [phone]        The results of significant diagnostics from this hospitalization (including imaging, microbiology, ancillary and laboratory) are listed below for reference.    Significant Diagnostic Studies: No results found.  Microbiology: Recent Results (from the past 240 hour(s))  Urine culture     Status: None   Collection Time: 09/20/15  2:25 PM  Result Value Ref Range Status   Specimen Description URINE, CATHETERIZED  Final   Special Requests NONE  Final   Culture NO GROWTH  Final   Report Status 09/21/2015 FINAL  Final     [name]: Basic Metabolic Panel:  Recent [name] Lab 09/20/15 0920  09/21/15 0601 09/21/15 0900 09/22/15 0527 09/22/15 0843  NA 133* 131*  --   --  132*  K 3.3* 2.6*  --   --  4.0  CL 95* 94*  --   --  103  CO2 29 27  --   --  22  GLUCOSE 90 89  --   --  116*  BUN 14 8  --   --  10  CREATININE 0.88 0.83  --   --  0.95  CALCIUM 9.0 8.4*  --   --  8.2*  MG  --   --  1.3* 2.2  --    Liver Function Tests:  Recent [name] Lab 09/20/15 0920  AST 31  ALT 12*  ALKPHOS 81  BILITOT 1.0  PROT 7.0  ALBUMIN 3.5   No results for input(s): LIPASE, AMYLASE in the last 168 hours. No results for input(s): AMMONIA in the last 168 hours. CBC:  Recent [name] Lab 09/20/15 0920 09/21/15 0601 09/22/15 0843  WBC 7.5 9.2 9.4  HGB 13.6 13.0 12.3  HCT 39.1 38.6 36.7  MCV 88.7 88.7 89.5  PLT 322 343 274   Cardiac Enzymes: No results for input(s): CKTOTAL, CKMB, CKMBINDEX, TROPONINI in the last 168 hours. BNP: BNP (last 3 results) No results for input(s): BNP in the last 8760 hours.  ProBNP (last 3 results) No results for  input(s): PROBNP in the last 8760 hours.  CBG: No results for input(s): GLUCAP in the last 168 hours.     SignedIrine Seal [name].  [facility] 09/22/2015, 1:16 PM

## 2015-09-22 NOTE — Care Management Note (Signed)
Case Management Note  Patient Details  Name: [name] MRN: [id number] Date of Birth: [birth year]  Subjective/Objective:                  Acute lower GI bleeding Action/Plan: Discharge planning Expected Discharge Date:  09/22/15               Expected Discharge Plan:  [place]  In-House Referral:     Discharge planning Services  CM Consult  Post Acute Care Choice:    Choice offered to:  Patient  DME Arranged:  N/A DME Agency:  NA  HH Arranged:  RN, PT, OT, Nurse's Aide, Social Work [employer] Agency:  [place]  Status of Service:  Completed, signed off  If discussed at [employer], dates discussed:    Additional Comments: CM met with pt and pt's daughter in room to explain ramifications of OBS vs INP status; family verbalized understanding.  CM offer choice of home health agency.  Family chooses [facility] to render HHPT/OT/RN/Aide/SW.  Pt has rollator, bedside commode and cane at home and denies need for additional DME.  Pt lives ind living at [facility] and has meal provision but not "help."  CM offer a Therapist, music but family stats this is not economically feasible and they state they will do better hiring from the "pool;" of aide workers at [employer].  Referral has been called to [facility] rep, Tiffany.  No other CM needs were communicated. Kailen, Name, RN 09/22/2015, 4:26 PM

## 2015-09-22 NOTE — Progress Notes (Signed)
Patient was initially to be discharged earlier today however SBP remained in 180s-190s and as such discharge was cancelled.

## 2015-09-22 NOTE — Evaluation (Signed)
Physical Therapy Evaluation Patient Details Name: [name] MRN: [id number] DOB: 01-22-1920 Today's Date: 09/22/2015   History of Present Illness  82 y.o. female with medical history significant for history of CVA, known osteoporosis and chronic recurrent urinary incontinence, hypertension, diverticulosis, and GERD who presents to the ER with reports of acute onset of bright red blood after passage of bowel movemen. Admitted for acute lower GI bleed, and hypokalemia/hypomagnesemia.  Clinical Impression  Pt admitted with above diagnosis. Pt currently with functional limitations due to the deficits listed below (see PT Problem List). Presents with generalized weakness and difficulty with balance. She required physical assist for transfers and bed mobility today but was independent PTA. Pt is at high risk for fall and would benefit from ST SNF prior to returning to independent living facility. Pt will benefit from skilled PT to increase their independence and safety with mobility while admitted.    Follow Up Recommendations SNF (If does not qualify, recommend HHPT and 24/7 assist.)    Equipment Recommendations  None recommended by PT    Recommendations for Other Services OT consult     Precautions / Restrictions Precautions Precautions: Fall Precaution Comments: back precautions for comfort, reports hx of compression fx Restrictions Weight Bearing Restrictions: No      Mobility  Bed Mobility Overal bed mobility: Needs Assistance Bed Mobility: Supine to Sit     Supine to sit: Min assist     General bed mobility comments: Min assist to scoot to EOB. VC for technique.  Transfers Overall transfer level: Needs assistance Equipment used: Rolling walker (2 wheeled) Transfers: Sit to/from Stand Sit to Stand: Min assist         General transfer comment: Min assist for boost from toilet, min guard from bed and recliner. Cues for hand placement. Unable to release hands and maintain  balance once upright.  Ambulation/Gait Ambulation/Gait assistance: Min assist Ambulation Distance (Feet): 15 Feet (x2) Assistive device: Rolling walker (2 wheeled);1 person hand held assist Gait Pattern/deviations: Step-through pattern;Decreased stride length;Shuffle;Trunk flexed;Antalgic Gait velocity: slow Gait velocity interpretation: <1.8 ft/sec, indicative of risk for recurrent falls General Gait Details: Significantly flexed posture. Heavy reliance on rollator for support, unable to ambulate with cane when attempted due to LOB. Min assist with hand held support. Cues for technique and safety throughout.  Stairs            Wheelchair Mobility    Modified Rankin (Stroke Patients Only)       Balance Overall balance assessment: Needs assistance Sitting-balance support: No upper extremity supported;Feet supported Sitting balance-Leahy Scale: Fair     Standing balance support: Single extremity supported Standing balance-Leahy Scale: Poor                               Pertinent Vitals/Pain Pain Assessment: Faces Pain Score: 4  Faces Pain Scale: Hurts little more Pain Location: Rt foot  Pain Descriptors / Indicators: Aching Pain Intervention(s): Monitored during session;Repositioned    Home Living Family/patient expects to be discharged to:: Other (Comment) ("Independent living") Living Arrangements: Alone Available Help at Discharge: Family;Available 24 hours/day ("states daughter will stay with her" unconfirmed) Type of Home: Independent living facility ("[facility]") Home Access: Level entry     Home Layout: One level Home Equipment: Environmental consultant - 4 wheels;Cane - single point      Prior Function Level of Independence: Independent with assistive device(s)         Comments:  PTA pt able to bath/dress herself.     Hand Dominance        Extremity/Trunk Assessment   Upper Extremity Assessment: Defer to OT evaluation           Lower  Extremity Assessment: Generalized weakness (pain with Rt foot dorsiflexion)         Communication   Communication: No difficulties  Cognition Arousal/Alertness: Awake/alert Behavior During Therapy: WFL for tasks assessed/performed Overall Cognitive Status: Within Functional Limits for tasks assessed                      General Comments      Exercises Other Exercises Other Exercises: Passive dorsiflexion stretch BIL ankles 3x30 seconds with use of sheet to pull      Assessment/Plan    PT Assessment Patient needs continued PT services  PT Diagnosis Difficulty walking;Abnormality of gait;Generalized weakness;Acute pain   PT Problem List Decreased strength;Decreased range of motion;Decreased activity tolerance;Decreased balance;Decreased mobility;Pain  PT Treatment Interventions DME instruction;Gait training;Functional mobility training;Therapeutic activities;Therapeutic exercise;Balance training;Neuromuscular re-education;Patient/family education   PT Goals (Current goals can be found in the Care Plan section) Acute Rehab PT Goals Patient Stated Goal: Get my strength back PT Goal Formulation: With patient Time For Goal Achievement: 10/06/15 Potential to Achieve Goals: Good    Frequency Min 3X/week   Barriers to discharge Decreased caregiver support Lives independent living.    Co-evaluation               End of Session Equipment Utilized During Treatment: Gait belt Activity Tolerance: Patient limited by fatigue Patient left: in chair;with call bell/phone within reach;with chair alarm set;with nursing/sitter in room Nurse Communication: Mobility status    Functional Assessment Tool Used: clinical observation Functional Limitation: Mobility: Walking and moving around Mobility: Walking and Moving Around Current Status [id number]): At least 20 percent but less than 40 percent impaired, limited or restricted Mobility: Walking and Moving Around Goal Status  [phone]): At least 1 percent but less than 20 percent impaired, limited or restricted    Time: 1000-1020 PT Time Calculation (min) (ACUTE ONLY): 20 min   Charges:   PT Evaluation $PT Eval Moderate Complexity: 1 Procedure     PT G Codes:   PT G-Codes **NOT FOR INPATIENT CLASS** Functional Assessment Tool Used: clinical observation Functional Limitation: Mobility: Walking and moving around Mobility: Walking and Moving Around Current Status [id number]): At least 20 percent but less than 40 percent impaired, limited or restricted Mobility: Walking and Moving Around Goal Status [phone]): At least 1 percent but less than 20 percent impaired, limited or restricted    Ellouise Newer 09/22/2015, [place], [place]

## 2015-09-23 DIAGNOSIS — E871 Hypo-osmolality and hyponatremia: Secondary | ICD-10-CM | POA: Diagnosis not present

## 2015-09-23 DIAGNOSIS — I10 Essential (primary) hypertension: Secondary | ICD-10-CM | POA: Diagnosis not present

## 2015-09-23 DIAGNOSIS — K921 Melena: Secondary | ICD-10-CM | POA: Diagnosis not present

## 2015-09-23 DIAGNOSIS — E876 Hypokalemia: Secondary | ICD-10-CM | POA: Diagnosis not present

## 2015-09-23 DIAGNOSIS — K922 Gastrointestinal hemorrhage, unspecified: Secondary | ICD-10-CM | POA: Diagnosis not present

## 2015-09-23 LAB — CBC
HEMATOCRIT: 32.6 % — AB (ref 36.0–46.0)
HEMOGLOBIN: 11.1 g/dL — AB (ref 12.0–15.0)
MCH: 30.2 pg (ref 26.0–34.0)
MCHC: 34 g/dL (ref 30.0–36.0)
MCV: 88.8 fL (ref 78.0–100.0)
Platelets: 261 10*3/uL (ref 150–400)
RBC: 3.67 MIL/uL — ABNORMAL LOW (ref 3.87–5.11)
RDW: 14.1 % (ref 11.5–15.5)
WBC: 9.7 10*3/uL (ref 4.0–10.5)

## 2015-09-23 LAB — MAGNESIUM: MAGNESIUM: 1.7 mg/dL (ref 1.7–2.4)

## 2015-09-23 LAB — BASIC METABOLIC PANEL
Anion gap: 6 (ref 5–15)
BUN: 10 mg/dL (ref 6–20)
CHLORIDE: 101 mmol/L (ref 101–111)
CO2: 23 mmol/L (ref 22–32)
Calcium: 7.9 mg/dL — ABNORMAL LOW (ref 8.9–10.3)
Creatinine, Ser: 0.97 mg/dL (ref 0.44–1.00)
GFR, EST AFRICAN AMERICAN: 57 mL/min — AB (ref >60)
GFR, EST NON AFRICAN AMERICAN: 49 mL/min — AB (ref >60)
GLUCOSE: 102 mg/dL — AB (ref 65–99)
Potassium: 3.3 mmol/L — ABNORMAL LOW (ref 3.5–5.1)
Sodium: 130 mmol/L — ABNORMAL LOW (ref 135–145)

## 2015-09-23 MED ORDER — POTASSIUM CHLORIDE CRYS ER 20 MEQ PO TBCR
40.0000 meq | EXTENDED_RELEASE_TABLET | ORAL | Status: AC
  Administered 2015-09-23 (×2): 40 meq via ORAL
  Filled 2015-09-23 (×2): qty 2

## 2015-09-23 MED ORDER — GABAPENTIN 250 MG/5ML PO SOLN: 200.0000 mg | Freq: Three times a day (TID) | ORAL | Status: DC

## 2015-09-23 MED ORDER — ACETAMINOPHEN 500 MG PO TABS: 500.0000 mg | ORAL_TABLET | Freq: Three times a day (TID) | ORAL | Status: DC

## 2015-09-23 MED ORDER — OMEPRAZOLE 20 MG PO CPDR: 20.0000 mg | DELAYED_RELEASE_CAPSULE | Freq: Every day | ORAL | Status: DC

## 2015-09-23 NOTE — Evaluation (Signed)
Occupational Therapy Evaluation Patient Details Name: [name] MRN: [id number] DOB: 10/28/1919 Today's Date: 09/23/2015    History of Present Illness 82 y.o. female with medical history significant for history of CVA, known osteoporosis and chronic recurrent urinary incontinence, hypertension, diverticulosis, and GERD who presents to the ER with reports of acute onset of bright red blood after passage of bowel movemen. Admitted for acute lower GI bleed, and hypokalemia/hypomagnesemia.   Clinical Impression   Pt reports she was managing ADLs independently PTA. Currently pt overall min assist for ADLs and functional mobility. With short distance functional mobility in room pt with increased SOB; RN notified. Educated pt on pursed lip breathing strategies and energy conservation. Recommending SNF for follow up in order to maximize independence and safety with ADLs and functional mobility. If pt is unable to d/c to SNF; recommend [facility] for follow up. Pt would benefit from continued skilled OT to address established goals.    Follow Up Recommendations  SNF;Supervision/Assistance - 24 hour (if not SNF, will need HHOT)    Equipment Recommendations  None recommended by OT    Recommendations for Other Services       Precautions / Restrictions Precautions Precautions: Fall Restrictions Weight Bearing Restrictions: No      Mobility Bed Mobility               General bed mobility comments: Pt OOB in chair upon arrival.  Transfers Overall transfer level: Needs assistance Equipment used: Rolling walker (2 wheeled) Transfers: Sit to/from Stand Sit to Stand: Min assist         General transfer comment: Min assist to boost up from chair/toilet. Min assist for balance in standing.     Balance Overall balance assessment: Needs assistance Sitting-balance support: Feet supported;No upper extremity supported Sitting balance-Leahy Scale: Fair     Standing balance support: No upper  extremity supported;During functional activity Standing balance-Leahy Scale: Poor Standing balance comment: Min assist for support in standing during peri care                            ADL Overall ADL's : Needs assistance/impaired Eating/Feeding: Set up;Sitting   Grooming: Minimal assistance;Standing Grooming Details (indicate cue type and reason): assist for balance in standing Upper Body Bathing: Min guard;Sitting   Lower Body Bathing: Minimal assistance;Sit to/from stand Lower Body Bathing Details (indicate cue type and reason): Assist for balance in standing Upper Body Dressing : Min guard;Sitting   Lower Body Dressing: Minimal assistance;Sit to/from stand Lower Body Dressing Details (indicate cue type and reason): Assist for balance in standing Toilet Transfer: Minimal assistance;Ambulation;Regular Toilet;RW   Toileting- Clothing Manipulation and Hygiene: Minimal assistance;Sit to/from stand Toileting - Clothing Manipulation Details (indicate cue type and reason): Assist for balance in standing and to pull up brief all the way     Functional mobility during ADLs: Minimal assistance;Rolling walker General ADL Comments: Discussed energy conservation strategies, home safety, and fall prevention with pt and daughter. Pt with increased SOB during short distance functional mobility in room.     Vision Vision Assessment?: No apparent visual deficits   Perception     Praxis      Pertinent Vitals/Pain Pain Assessment: No/denies pain     Hand Dominance     Extremity/Trunk Assessment Upper Extremity Assessment Upper Extremity Assessment: Generalized weakness (pt reports R shoulder injury)   Lower Extremity Assessment Lower Extremity Assessment: Defer to PT evaluation   Cervical / Trunk Assessment  Cervical / Trunk Assessment: Kyphotic   Communication Communication Communication: No difficulties   Cognition Arousal/Alertness: Awake/alert Behavior During  Therapy: WFL for tasks assessed/performed Overall Cognitive Status: Within Functional Limits for tasks assessed                     General Comments       Exercises       Shoulder Instructions      Home Living Family/patient expects to be discharged to:: Private residence Living Arrangements: Alone Available Help at Discharge: Family;Available 24 hours/day Type of Home: Independent living facility [facility]) Home Access: Level entry     Home Layout: One level     Bathroom Shower/Tub: Occupational psychologist: Standard     Home Equipment: Environmental consultant - 4 wheels;Cane - single point;Shower seat   Additional Comments: Pt planning to stay with daughter or daughter will stay with pt; 24/7 supervision      Prior Functioning/Environment Level of Independence: Independent with assistive device(s)        Comments: PTA pt able to bath/dress herself. Eats meals at dining hall, facility cleans and does laundry 1x per week    OT Diagnosis: Generalized weakness   OT Problem List: Decreased strength;Decreased range of motion;Decreased activity tolerance;Impaired balance (sitting and/or standing)   OT Treatment/Interventions: Self-care/ADL training;Therapeutic exercise;Energy conservation;DME and/or AE instruction;Therapeutic activities;Patient/family education;Balance training    OT Goals(Current goals can be found in the care plan section) Acute Rehab OT Goals Patient Stated Goal: Get my strength back OT Goal Formulation: With patient/family Time For Goal Achievement: 10/07/15 Potential to Achieve Goals: Good ADL Goals Pt Will Perform Grooming: with modified independence;standing Pt Will Perform Upper Body Bathing: with modified independence;sitting Pt Will Perform Lower Body Bathing: with modified independence;sit to/from stand Pt Will Transfer to Toilet: with modified independence;ambulating;regular height toilet Pt Will Perform Toileting - Clothing  Manipulation and hygiene: with modified independence;sit to/from stand  OT Frequency: Min 2X/week   Barriers to D/C:            Co-evaluation              End of Session Equipment Utilized During Treatment: Gait belt;Rolling walker Nurse Communication: Other (comment) (Pt with SOB during mobility)  Activity Tolerance: Other (comment) (Increased SOB during functional mobility) Patient left: in chair;with call bell/phone within reach;with chair alarm set;with family/visitor present   Time: [id number] OT Time Calculation (min): 23 min Charges:  OT General Charges $OT Visit: 1 Procedure OT Evaluation $OT Eval Moderate Complexity: 1 Procedure OT Treatments $Self Care/Home Management : 8-22 mins G-Codes: OT G-codes **NOT FOR INPATIENT CLASS** Functional Assessment Tool Used: Clinical judgement Functional Limitation: Self care Self Care Current Status [id number]): At least 1 percent but less than 20 percent impaired, limited or restricted Self Care Goal Status [id number]): At least 1 percent but less than 20 percent impaired, limited or restricted   Binnie Kand M.S., OTR/L Pager: [phone]  09/23/2015, 3:12 PM

## 2015-09-23 NOTE — Progress Notes (Signed)
PROGRESS NOTE    [name]  [id number] DOB: 04/27/1919 DOA: 09/20/2015 PCP: Merrilee Seashore, MD   Assessment & Plan:   Principal Problem:   Acute lower GI bleeding Active Problems:   Moderate tricuspid regurgitation   Hypertension   History of CVA (cerebrovascular accident)   Osteoporosis   GERD (gastroesophageal reflux disease)   Urinary incontinence   LLQ abdominal pain   Chronic hyponatremia   Diverticulosis of colon   Hypokalemia   Hypomagnesemia  #1 acute lower GI bleed Likely secondary to hemorrhoids. Patient with no further bleeding. Hemoglobin has remained stable. Patient tolerating soft diet. Case was discussed with GI who felt this was likely self-limiting and if patient with persistent bleeding with a drop in the hemoglobin will likely need a formal GI consult at that time. Outpatient follow-up.  #2 hypokalemia/hypomagnesemia Likely secondary to problem #1. Replete. Keep magnesium greater than 2.  #3 gastroesophageal reflux disease PPI.  #4 chronic hyponatremia Stable.  #5 history of CVA Aspirin on hold. Resume in 1 week.  #6 hypertension Improved. Continue Zebeta and Cozaar, isradapine.   DVT prophylaxis SCDs Code Status: DO NOT RESUSCITATE Family Communication: Updated patient and daughter at bedside. Disposition Plan: Back to assisted living facility today with [place].   Consultants:   None  Procedures:   None  Antimicrobials:   None   Subjective: Patient denies any chest pain. No shortness of breath. Per nurse tech patient with no further bloody bowel movements. Patient tolerating soft diet. Arthritic pain improved per patient. BP improved.  Objective: Filed Vitals:   09/23/15 0620 09/23/15 0957 09/23/15 1017 09/23/15 1242  BP: 170/67 114/52 143/54 157/55  Pulse: 67 76 79 77  Temp: 98.1 F (36.7 C)     TempSrc: Oral     Resp:      Height:      Weight:      SpO2: 96%   95%    Intake/Output Summary (Last 24 hours) at  09/23/15 1336 Last data filed at 09/23/15 1302  Gross per 24 hour  Intake    800 ml  Output    100 ml  Net    700 ml   Filed Weights   09/21/15 0627  Weight: 42.7 kg (94 lb 2.2 oz)    Examination:  General exam: Appears calm and comfortable  Respiratory system: Clear to auscultation. Respiratory effort normal. Cardiovascular system: S1 & S2 heard, RRR. No JVD, murmurs, rubs, gallops or clicks. No pedal edema. Gastrointestinal system: Abdomen is nondistended, soft and nontender. No organomegaly or masses felt. Normal bowel sounds heard. Central nervous system: Alert and oriented. No focal neurological deficits. Extremities: Symmetric 5 x 5 power. Skin: No rashes, lesions or ulcers Psychiatry: Judgement and insight appear normal. Mood & affect appropriate.     Data Reviewed: I have personally reviewed following labs and imaging studies  CBC:  Recent Labs Lab 09/20/15 0920 09/21/15 0601 09/22/15 0843 09/23/15 0441  WBC 7.5 9.2 9.4 9.7  HGB 13.6 13.0 12.3 11.1*  HCT 39.1 38.6 36.7 32.6*  MCV 88.7 88.7 89.5 88.8  PLT 322 343 274 0000000   Basic Metabolic Panel:  Recent Labs Lab 09/20/15 0920 09/21/15 0601 09/21/15 0900 09/22/15 0527 09/22/15 0843 09/23/15 0441 09/23/15 0844  NA 133* 131*  --   --  132* 130*  --   K 3.3* 2.6*  --   --  4.0 3.3*  --   CL 95* 94*  --   --  103 101  --  CO2 29 27  --   --  22 23  --   GLUCOSE 90 89  --   --  116* 102*  --   BUN 14 8  --   --  10 10  --   CREATININE 0.88 0.83  --   --  0.95 0.97  --   CALCIUM 9.0 8.4*  --   --  8.2* 7.9*  --   MG  --   --  1.3* 2.2  --   --  1.7   GFR: Estimated Creatinine Clearance: 20.8 mL/min (by C-G formula based on Cr of 0.97). Liver Function Tests:  Recent Labs Lab 09/20/15 0920  AST 31  ALT 12*  ALKPHOS 81  BILITOT 1.0  PROT 7.0  ALBUMIN 3.5   No results for input(s): LIPASE, AMYLASE in the last 168 hours. No results for input(s): AMMONIA in the last 168 hours. Coagulation  Profile: No results for input(s): INR, PROTIME in the last 168 hours. Cardiac Enzymes: No results for input(s): CKTOTAL, CKMB, CKMBINDEX, TROPONINI in the last 168 hours. BNP (last 3 results) No results for input(s): PROBNP in the last 8760 hours. HbA1C: No results for input(s): HGBA1C in the last 72 hours. CBG: No results for input(s): GLUCAP in the last 168 hours. Lipid Profile: No results for input(s): CHOL, HDL, LDLCALC, TRIG, CHOLHDL, LDLDIRECT in the last 72 hours. Thyroid Function Tests: No results for input(s): TSH, T4TOTAL, FREET4, T3FREE, THYROIDAB in the last 72 hours. Anemia Panel: No results for input(s): VITAMINB12, FOLATE, FERRITIN, TIBC, IRON, RETICCTPCT in the last 72 hours. Sepsis Labs: No results for input(s): PROCALCITON, LATICACIDVEN in the last 168 hours.  Recent Results (from the past 240 hour(s))  Urine culture     Status: None   Collection Time: 09/20/15  2:25 PM  Result Value Ref Range Status   Specimen Description URINE, CATHETERIZED  Final   Special Requests NONE  Final   Culture NO GROWTH  Final   Report Status 09/21/2015 FINAL  Final         Radiology Studies: No results found.      Scheduled Meds: . acetaminophen  500 mg Oral TID  . amLODipine  5 mg Oral Daily  . bisoprolol  5 mg Oral Daily  . feeding supplement  1 Container Oral TID BM  . gabapentin  200 mg Oral Q8H  . guanFACINE  1 mg Oral QHS  . losartan  100 mg Oral Daily  . pantoprazole  40 mg Oral Daily  . potassium chloride  40 mEq Oral Q4H  . sodium chloride flush  3 mL Intravenous Q12H   Continuous Infusions:        Time spent: 35 minutes    Windie Marasco, MD [facility] Pager [phone]  If 7PM-7AM, please contact night-coverage [email or web address] Password [email or web address] 09/23/2015, 1:36 PM

## 2015-09-23 NOTE — Progress Notes (Signed)
   09/23/15 0957  Vitals  BP (!) 114/52 mmHg  MAP (mmHg) 68  BP Location Right Arm  BP Method Automatic  Patient Position (if appropriate) Lying  Pulse Rate 76   Dr. Grandville Silos notified to see if wanted to hold BP meds this.  Re-checked BP with pt. Sitting up in the chair on the left arm and was 143/54 and right arm was 120/63, BP meds given as ordered, will continue to monitor.  Alphonzo Lemmings, RN

## 2015-09-23 NOTE — Progress Notes (Signed)
Physical Therapy Treatment Patient Details Name: [name] MRN: [id number] DOB: 06-20-1919 Today's Date: 09/23/2015    History of Present Illness 82 y.o. female with medical history significant for history of CVA, known osteoporosis and chronic recurrent urinary incontinence, hypertension, diverticulosis, and GERD who presents to the ER with reports of acute onset of bright red blood after passage of bowel movemen. Admitted for acute lower GI bleed, and hypokalemia/hypomagnesemia.    PT Comments    Patient with some dyspnea this session with ambulation.  Seems set that she will go home today with family support and HHPT.  She admits her breathing is short with activity, but downplaying it currently as she is eager to go home.  Educated to monitor and that [place] will monitor it and that she needs to get further medical attention if it worsens.  Patient and daughter verbalized understanding. Currently not qualifying for home O2 with SpO2 94% with ambulation on RA.  Follow Up Recommendations  Home health PT;Supervision/Assistance - 24 hour     Equipment Recommendations  None recommended by PT    Recommendations for Other Services       Precautions / Restrictions Precautions Precautions: Fall Precaution Comments: back precautions for comfort, reports hx of compression fx Restrictions Weight Bearing Restrictions: No    Mobility  Bed Mobility               General bed mobility comments: Pt OOB in chair upon arrival.  Transfers Overall transfer level: Needs assistance Equipment used: 4-wheeled walker Transfers: Sit to/from Stand Sit to Stand: Min guard         General transfer comment: for balance up from recliner  Ambulation/Gait Ambulation/Gait assistance: Min guard Ambulation Distance (Feet): 30 Feet Assistive device: 4-wheeled walker Gait Pattern/deviations: Step-through pattern;Trunk flexed;Shuffle;Decreased stride length     General Gait Details: ambulated on  RA with SpO2 monitored throughout maintained at 94-97%, though SOB with dyspnea 3/4   Stairs            Wheelchair Mobility    Modified Rankin (Stroke Patients Only)       Balance Overall balance assessment: Needs assistance Sitting-balance support: Feet supported;No upper extremity supported Sitting balance-Leahy Scale: Fair     Standing balance support: Bilateral upper extremity supported Standing balance-Leahy Scale: Poor Standing balance comment: needs UE support for balance                    Cognition Arousal/Alertness: Awake/alert Behavior During Therapy: WFL for tasks assessed/performed Overall Cognitive Status: Within Functional Limits for tasks assessed                      Exercises      General Comments General comments (skin integrity, edema, etc.): Educated that although SOB is not severe (and currently not requiring home O2) it is a symptom that needs to be monitored and checked out if it worsens (patient downplaying it as she is eager for d/c home)      Pertinent Vitals/Pain Pain Assessment: No/denies pain Faces Pain Scale: Hurts little more Pain Location: R UE Pain Descriptors / Indicators: Aching Pain Intervention(s): Monitored during session;Repositioned    Home Living Family/patient expects to be discharged to:: Private residence Living Arrangements: Alone Available Help at Discharge: Family;Available 24 hours/day Type of Home: Independent living facility [facility]) Home Access: Level entry   Home Layout: One level Home Equipment: Environmental consultant - 4 wheels;Cane - single point;Shower seat Additional Comments: Pt planning to  stay with daughter or daughter will stay with pt; 24/7 supervision    Prior Function Level of Independence: Independent with assistive device(s)      Comments: PTA pt able to bath/dress herself. Eats meals at dining hall, facility cleans and does laundry 1x per week   PT Goals (current goals can now  be found in the care plan section) Acute Rehab PT Goals Patient Stated Goal: Get my strength back Progress towards PT goals: Progressing toward goals    Frequency  Min 3X/week    PT Plan Discharge plan needs to be updated    Co-evaluation             End of Session Equipment Utilized During Treatment: Gait belt Activity Tolerance: Patient limited by fatigue Patient left: in chair;with call bell/phone within reach;with family/visitor present     Time: [id number] PT Time Calculation (min) (ACUTE ONLY): 13 min  Charges:  $Gait Training: 8-22 mins                    G Codes:      Reginia Naas 2015/09/28, 4:34 PM  Magda Kiel, [place] 09-28-15

## 2015-09-23 NOTE — Progress Notes (Signed)
[name] discharged Home with H/H PT/OT/RN/Aide/SW per MD order.  Discharge instructions reviewed and discussed with the patient and pt. daughter, all questions and concerns answered. Copy of instructions, care notes of new medications/diagnosis and scripts given to patient.    Medication List    TAKE these medications        acetaminophen 500 MG tablet  Commonly known as:  TYLENOL  Take 1 tablet (500 mg total) by mouth 3 (three) times daily. Take 1 tablet (500mg ) 3 times daily x 5 days, then every 6 hours as needed.     aspirin 81 MG tablet  Take 1 tablet (81 mg total) by mouth See admin instructions. Every 3 days  Start taking on:  09/29/2015     bisoprolol 5 MG tablet  Commonly known as:  ZEBETA  Take 5 mg by mouth daily.     feeding supplement Liqd  Take 1 Container by mouth 3 (three) times daily between meals.     gabapentin 250 MG/5ML solution  Commonly known as:  NEURONTIN  Take 4 mLs (200 mg total) by mouth every 8 (eight) hours. Take for 1 week, then stop.     guanFACINE 2 MG tablet  Commonly known as:  TENEX  Take 1 mg by mouth at bedtime.     HYDROcodone-acetaminophen 5-325 MG tablet  Commonly known as:  NORCO/VICODIN  Take 0.5 tablets by mouth every 8 (eight) hours as needed for severe pain.     isradipine 5 MG capsule  Commonly known as:  DYNACIRC  Take 5 mg by mouth daily.     losartan 100 MG tablet  Commonly known as:  COZAAR  Take 100 mg by mouth daily.     omeprazole 20 MG capsule  Commonly known as:  PRILOSEC  Take 1 capsule (20 mg total) by mouth daily.     pramoxine 1 % foam  Commonly known as:  PROCTOFOAM  Place rectally 3 (three) times daily as needed for itching.     REFRESH OP  Place 1 drop into both eyes daily. Take every day per patient        Patients skin is clean, dry and intact, no evidence of skin break down. IV site discontinued and catheter remains intact. Site without signs and symptoms of complications. Dressing and  pressure applied.  Patient escorted to car by NT in a wheelchair,  no distress noted upon discharge.  Wynetta Emery, Truman Aceituno C 09/23/2015 4:29 PM

## 2015-09-25 DIAGNOSIS — Z8673 Personal history of transient ischemic attack (TIA), and cerebral infarction without residual deficits: Secondary | ICD-10-CM | POA: Diagnosis not present

## 2015-09-25 DIAGNOSIS — K5790 Diverticulosis of intestine, part unspecified, without perforation or abscess without bleeding: Secondary | ICD-10-CM | POA: Diagnosis not present

## 2015-09-25 DIAGNOSIS — I10 Essential (primary) hypertension: Secondary | ICD-10-CM | POA: Diagnosis not present

## 2015-09-25 DIAGNOSIS — I340 Nonrheumatic mitral (valve) insufficiency: Secondary | ICD-10-CM | POA: Diagnosis not present

## 2015-09-25 DIAGNOSIS — K219 Gastro-esophageal reflux disease without esophagitis: Secondary | ICD-10-CM | POA: Diagnosis not present

## 2015-09-25 DIAGNOSIS — Z96643 Presence of artificial hip joint, bilateral: Secondary | ICD-10-CM | POA: Diagnosis not present

## 2015-09-25 DIAGNOSIS — K649 Unspecified hemorrhoids: Secondary | ICD-10-CM | POA: Diagnosis not present

## 2015-09-25 DIAGNOSIS — M810 Age-related osteoporosis without current pathological fracture: Secondary | ICD-10-CM | POA: Diagnosis not present

## 2015-09-25 DIAGNOSIS — Z8719 Personal history of other diseases of the digestive system: Secondary | ICD-10-CM | POA: Diagnosis not present

## 2015-09-25 DIAGNOSIS — Z7982 Long term (current) use of aspirin: Secondary | ICD-10-CM | POA: Diagnosis not present

## 2015-09-26 DIAGNOSIS — K649 Unspecified hemorrhoids: Secondary | ICD-10-CM | POA: Diagnosis not present

## 2015-09-26 DIAGNOSIS — I340 Nonrheumatic mitral (valve) insufficiency: Secondary | ICD-10-CM | POA: Diagnosis not present

## 2015-09-26 DIAGNOSIS — Z8719 Personal history of other diseases of the digestive system: Secondary | ICD-10-CM | POA: Diagnosis not present

## 2015-09-26 DIAGNOSIS — M810 Age-related osteoporosis without current pathological fracture: Secondary | ICD-10-CM | POA: Diagnosis not present

## 2015-09-26 DIAGNOSIS — K5790 Diverticulosis of intestine, part unspecified, without perforation or abscess without bleeding: Secondary | ICD-10-CM | POA: Diagnosis not present

## 2015-09-26 DIAGNOSIS — I10 Essential (primary) hypertension: Secondary | ICD-10-CM | POA: Diagnosis not present

## 2015-09-27 DIAGNOSIS — I10 Essential (primary) hypertension: Secondary | ICD-10-CM | POA: Diagnosis not present

## 2015-09-27 DIAGNOSIS — Z8719 Personal history of other diseases of the digestive system: Secondary | ICD-10-CM | POA: Diagnosis not present

## 2015-09-27 DIAGNOSIS — M810 Age-related osteoporosis without current pathological fracture: Secondary | ICD-10-CM | POA: Diagnosis not present

## 2015-09-27 DIAGNOSIS — K649 Unspecified hemorrhoids: Secondary | ICD-10-CM | POA: Diagnosis not present

## 2015-09-27 DIAGNOSIS — K5790 Diverticulosis of intestine, part unspecified, without perforation or abscess without bleeding: Secondary | ICD-10-CM | POA: Diagnosis not present

## 2015-09-27 DIAGNOSIS — I340 Nonrheumatic mitral (valve) insufficiency: Secondary | ICD-10-CM | POA: Diagnosis not present

## 2015-09-28 DIAGNOSIS — M810 Age-related osteoporosis without current pathological fracture: Secondary | ICD-10-CM | POA: Diagnosis not present

## 2015-09-28 DIAGNOSIS — Z8719 Personal history of other diseases of the digestive system: Secondary | ICD-10-CM | POA: Diagnosis not present

## 2015-09-28 DIAGNOSIS — K5730 Diverticulosis of large intestine without perforation or abscess without bleeding: Secondary | ICD-10-CM | POA: Diagnosis not present

## 2015-09-28 DIAGNOSIS — F322 Major depressive disorder, single episode, severe without psychotic features: Secondary | ICD-10-CM | POA: Diagnosis not present

## 2015-09-28 DIAGNOSIS — K649 Unspecified hemorrhoids: Secondary | ICD-10-CM | POA: Diagnosis not present

## 2015-09-28 DIAGNOSIS — D500 Iron deficiency anemia secondary to blood loss (chronic): Secondary | ICD-10-CM | POA: Diagnosis not present

## 2015-09-28 DIAGNOSIS — R600 Localized edema: Secondary | ICD-10-CM | POA: Diagnosis not present

## 2015-09-28 DIAGNOSIS — I340 Nonrheumatic mitral (valve) insufficiency: Secondary | ICD-10-CM | POA: Diagnosis not present

## 2015-09-28 DIAGNOSIS — I10 Essential (primary) hypertension: Secondary | ICD-10-CM | POA: Diagnosis not present

## 2015-09-28 DIAGNOSIS — K5790 Diverticulosis of intestine, part unspecified, without perforation or abscess without bleeding: Secondary | ICD-10-CM | POA: Diagnosis not present

## 2015-09-28 DIAGNOSIS — Z09 Encounter for follow-up examination after completed treatment for conditions other than malignant neoplasm: Secondary | ICD-10-CM | POA: Diagnosis not present

## 2015-10-01 DIAGNOSIS — Z8719 Personal history of other diseases of the digestive system: Secondary | ICD-10-CM | POA: Diagnosis not present

## 2015-10-01 DIAGNOSIS — K5790 Diverticulosis of intestine, part unspecified, without perforation or abscess without bleeding: Secondary | ICD-10-CM | POA: Diagnosis not present

## 2015-10-01 DIAGNOSIS — I340 Nonrheumatic mitral (valve) insufficiency: Secondary | ICD-10-CM | POA: Diagnosis not present

## 2015-10-01 DIAGNOSIS — I10 Essential (primary) hypertension: Secondary | ICD-10-CM | POA: Diagnosis not present

## 2015-10-01 DIAGNOSIS — K649 Unspecified hemorrhoids: Secondary | ICD-10-CM | POA: Diagnosis not present

## 2015-10-01 DIAGNOSIS — M810 Age-related osteoporosis without current pathological fracture: Secondary | ICD-10-CM | POA: Diagnosis not present

## 2015-10-02 DIAGNOSIS — I10 Essential (primary) hypertension: Secondary | ICD-10-CM | POA: Diagnosis not present

## 2015-10-02 DIAGNOSIS — Z8719 Personal history of other diseases of the digestive system: Secondary | ICD-10-CM | POA: Diagnosis not present

## 2015-10-02 DIAGNOSIS — K5790 Diverticulosis of intestine, part unspecified, without perforation or abscess without bleeding: Secondary | ICD-10-CM | POA: Diagnosis not present

## 2015-10-02 DIAGNOSIS — K649 Unspecified hemorrhoids: Secondary | ICD-10-CM | POA: Diagnosis not present

## 2015-10-02 DIAGNOSIS — M810 Age-related osteoporosis without current pathological fracture: Secondary | ICD-10-CM | POA: Diagnosis not present

## 2015-10-02 DIAGNOSIS — I340 Nonrheumatic mitral (valve) insufficiency: Secondary | ICD-10-CM | POA: Diagnosis not present

## 2015-10-03 DIAGNOSIS — I340 Nonrheumatic mitral (valve) insufficiency: Secondary | ICD-10-CM | POA: Diagnosis not present

## 2015-10-03 DIAGNOSIS — Z8719 Personal history of other diseases of the digestive system: Secondary | ICD-10-CM | POA: Diagnosis not present

## 2015-10-03 DIAGNOSIS — K649 Unspecified hemorrhoids: Secondary | ICD-10-CM | POA: Diagnosis not present

## 2015-10-03 DIAGNOSIS — M810 Age-related osteoporosis without current pathological fracture: Secondary | ICD-10-CM | POA: Diagnosis not present

## 2015-10-03 DIAGNOSIS — I10 Essential (primary) hypertension: Secondary | ICD-10-CM | POA: Diagnosis not present

## 2015-10-03 DIAGNOSIS — K5790 Diverticulosis of intestine, part unspecified, without perforation or abscess without bleeding: Secondary | ICD-10-CM | POA: Diagnosis not present

## 2015-10-04 DIAGNOSIS — K5790 Diverticulosis of intestine, part unspecified, without perforation or abscess without bleeding: Secondary | ICD-10-CM | POA: Diagnosis not present

## 2015-10-04 DIAGNOSIS — I10 Essential (primary) hypertension: Secondary | ICD-10-CM | POA: Diagnosis not present

## 2015-10-04 DIAGNOSIS — M810 Age-related osteoporosis without current pathological fracture: Secondary | ICD-10-CM | POA: Diagnosis not present

## 2015-10-04 DIAGNOSIS — I340 Nonrheumatic mitral (valve) insufficiency: Secondary | ICD-10-CM | POA: Diagnosis not present

## 2015-10-04 DIAGNOSIS — K649 Unspecified hemorrhoids: Secondary | ICD-10-CM | POA: Diagnosis not present

## 2015-10-04 DIAGNOSIS — Z8719 Personal history of other diseases of the digestive system: Secondary | ICD-10-CM | POA: Diagnosis not present

## 2015-10-05 DIAGNOSIS — K5790 Diverticulosis of intestine, part unspecified, without perforation or abscess without bleeding: Secondary | ICD-10-CM | POA: Diagnosis not present

## 2015-10-05 DIAGNOSIS — M810 Age-related osteoporosis without current pathological fracture: Secondary | ICD-10-CM | POA: Diagnosis not present

## 2015-10-05 DIAGNOSIS — K649 Unspecified hemorrhoids: Secondary | ICD-10-CM | POA: Diagnosis not present

## 2015-10-05 DIAGNOSIS — Z8719 Personal history of other diseases of the digestive system: Secondary | ICD-10-CM | POA: Diagnosis not present

## 2015-10-05 DIAGNOSIS — I10 Essential (primary) hypertension: Secondary | ICD-10-CM | POA: Diagnosis not present

## 2015-10-05 DIAGNOSIS — I340 Nonrheumatic mitral (valve) insufficiency: Secondary | ICD-10-CM | POA: Diagnosis not present

## 2015-10-08 DIAGNOSIS — Z8719 Personal history of other diseases of the digestive system: Secondary | ICD-10-CM | POA: Diagnosis not present

## 2015-10-08 DIAGNOSIS — K649 Unspecified hemorrhoids: Secondary | ICD-10-CM | POA: Diagnosis not present

## 2015-10-08 DIAGNOSIS — K5790 Diverticulosis of intestine, part unspecified, without perforation or abscess without bleeding: Secondary | ICD-10-CM | POA: Diagnosis not present

## 2015-10-08 DIAGNOSIS — I10 Essential (primary) hypertension: Secondary | ICD-10-CM | POA: Diagnosis not present

## 2015-10-08 DIAGNOSIS — M810 Age-related osteoporosis without current pathological fracture: Secondary | ICD-10-CM | POA: Diagnosis not present

## 2015-10-08 DIAGNOSIS — I340 Nonrheumatic mitral (valve) insufficiency: Secondary | ICD-10-CM | POA: Diagnosis not present

## 2015-10-09 DIAGNOSIS — K649 Unspecified hemorrhoids: Secondary | ICD-10-CM | POA: Diagnosis not present

## 2015-10-09 DIAGNOSIS — I10 Essential (primary) hypertension: Secondary | ICD-10-CM | POA: Diagnosis not present

## 2015-10-09 DIAGNOSIS — Z8719 Personal history of other diseases of the digestive system: Secondary | ICD-10-CM | POA: Diagnosis not present

## 2015-10-09 DIAGNOSIS — M810 Age-related osteoporosis without current pathological fracture: Secondary | ICD-10-CM | POA: Diagnosis not present

## 2015-10-09 DIAGNOSIS — K5790 Diverticulosis of intestine, part unspecified, without perforation or abscess without bleeding: Secondary | ICD-10-CM | POA: Diagnosis not present

## 2015-10-09 DIAGNOSIS — I340 Nonrheumatic mitral (valve) insufficiency: Secondary | ICD-10-CM | POA: Diagnosis not present

## 2015-10-10 DIAGNOSIS — M810 Age-related osteoporosis without current pathological fracture: Secondary | ICD-10-CM | POA: Diagnosis not present

## 2015-10-10 DIAGNOSIS — I10 Essential (primary) hypertension: Secondary | ICD-10-CM | POA: Diagnosis not present

## 2015-10-10 DIAGNOSIS — Z8719 Personal history of other diseases of the digestive system: Secondary | ICD-10-CM | POA: Diagnosis not present

## 2015-10-10 DIAGNOSIS — K649 Unspecified hemorrhoids: Secondary | ICD-10-CM | POA: Diagnosis not present

## 2015-10-10 DIAGNOSIS — I340 Nonrheumatic mitral (valve) insufficiency: Secondary | ICD-10-CM | POA: Diagnosis not present

## 2015-10-10 DIAGNOSIS — K5790 Diverticulosis of intestine, part unspecified, without perforation or abscess without bleeding: Secondary | ICD-10-CM | POA: Diagnosis not present

## 2015-10-11 DIAGNOSIS — K649 Unspecified hemorrhoids: Secondary | ICD-10-CM | POA: Diagnosis not present

## 2015-10-11 DIAGNOSIS — M810 Age-related osteoporosis without current pathological fracture: Secondary | ICD-10-CM | POA: Diagnosis not present

## 2015-10-11 DIAGNOSIS — I10 Essential (primary) hypertension: Secondary | ICD-10-CM | POA: Diagnosis not present

## 2015-10-11 DIAGNOSIS — K5790 Diverticulosis of intestine, part unspecified, without perforation or abscess without bleeding: Secondary | ICD-10-CM | POA: Diagnosis not present

## 2015-10-11 DIAGNOSIS — I340 Nonrheumatic mitral (valve) insufficiency: Secondary | ICD-10-CM | POA: Diagnosis not present

## 2015-10-11 DIAGNOSIS — Z8719 Personal history of other diseases of the digestive system: Secondary | ICD-10-CM | POA: Diagnosis not present

## 2015-10-12 DIAGNOSIS — K5790 Diverticulosis of intestine, part unspecified, without perforation or abscess without bleeding: Secondary | ICD-10-CM | POA: Diagnosis not present

## 2015-10-12 DIAGNOSIS — K649 Unspecified hemorrhoids: Secondary | ICD-10-CM | POA: Diagnosis not present

## 2015-10-12 DIAGNOSIS — I340 Nonrheumatic mitral (valve) insufficiency: Secondary | ICD-10-CM | POA: Diagnosis not present

## 2015-10-12 DIAGNOSIS — I10 Essential (primary) hypertension: Secondary | ICD-10-CM | POA: Diagnosis not present

## 2015-10-12 DIAGNOSIS — M810 Age-related osteoporosis without current pathological fracture: Secondary | ICD-10-CM | POA: Diagnosis not present

## 2015-10-12 DIAGNOSIS — Z8719 Personal history of other diseases of the digestive system: Secondary | ICD-10-CM | POA: Diagnosis not present

## 2015-10-15 DIAGNOSIS — M810 Age-related osteoporosis without current pathological fracture: Secondary | ICD-10-CM | POA: Diagnosis not present

## 2015-10-15 DIAGNOSIS — K649 Unspecified hemorrhoids: Secondary | ICD-10-CM | POA: Diagnosis not present

## 2015-10-15 DIAGNOSIS — K5790 Diverticulosis of intestine, part unspecified, without perforation or abscess without bleeding: Secondary | ICD-10-CM | POA: Diagnosis not present

## 2015-10-15 DIAGNOSIS — I10 Essential (primary) hypertension: Secondary | ICD-10-CM | POA: Diagnosis not present

## 2015-10-15 DIAGNOSIS — I340 Nonrheumatic mitral (valve) insufficiency: Secondary | ICD-10-CM | POA: Diagnosis not present

## 2015-10-15 DIAGNOSIS — Z8719 Personal history of other diseases of the digestive system: Secondary | ICD-10-CM | POA: Diagnosis not present

## 2015-10-16 DIAGNOSIS — I10 Essential (primary) hypertension: Secondary | ICD-10-CM | POA: Diagnosis not present

## 2015-10-16 DIAGNOSIS — Z8719 Personal history of other diseases of the digestive system: Secondary | ICD-10-CM | POA: Diagnosis not present

## 2015-10-16 DIAGNOSIS — K649 Unspecified hemorrhoids: Secondary | ICD-10-CM | POA: Diagnosis not present

## 2015-10-16 DIAGNOSIS — M810 Age-related osteoporosis without current pathological fracture: Secondary | ICD-10-CM | POA: Diagnosis not present

## 2015-10-16 DIAGNOSIS — I340 Nonrheumatic mitral (valve) insufficiency: Secondary | ICD-10-CM | POA: Diagnosis not present

## 2015-10-16 DIAGNOSIS — K5790 Diverticulosis of intestine, part unspecified, without perforation or abscess without bleeding: Secondary | ICD-10-CM | POA: Diagnosis not present

## 2015-10-17 DIAGNOSIS — M810 Age-related osteoporosis without current pathological fracture: Secondary | ICD-10-CM | POA: Diagnosis not present

## 2015-10-17 DIAGNOSIS — K5790 Diverticulosis of intestine, part unspecified, without perforation or abscess without bleeding: Secondary | ICD-10-CM | POA: Diagnosis not present

## 2015-10-17 DIAGNOSIS — K649 Unspecified hemorrhoids: Secondary | ICD-10-CM | POA: Diagnosis not present

## 2015-10-17 DIAGNOSIS — I10 Essential (primary) hypertension: Secondary | ICD-10-CM | POA: Diagnosis not present

## 2015-10-17 DIAGNOSIS — I340 Nonrheumatic mitral (valve) insufficiency: Secondary | ICD-10-CM | POA: Diagnosis not present

## 2015-10-17 DIAGNOSIS — Z8719 Personal history of other diseases of the digestive system: Secondary | ICD-10-CM | POA: Diagnosis not present

## 2015-10-23 ENCOUNTER — Ambulatory Visit
Admission: RE | Admit: 2015-10-23 | Discharge: 2015-10-23 | Disposition: A | Discharge status: Home / Self Care | Payer: Medicare Other | Source: Ambulatory Visit | Attending: Gastroenterology | Admitting: Gastroenterology

## 2015-10-23 DIAGNOSIS — K449 Diaphragmatic hernia without obstruction or gangrene: Secondary | ICD-10-CM | POA: Diagnosis not present

## 2015-10-23 DIAGNOSIS — R1310 Dysphagia, unspecified: Secondary | ICD-10-CM | POA: Diagnosis not present

## 2015-11-03 ENCOUNTER — Other Ambulatory Visit: Payer: Self-pay | Admitting: Gastroenterology

## 2015-11-03 DIAGNOSIS — R1310 Dysphagia, unspecified: Secondary | ICD-10-CM

## 2015-11-06 DIAGNOSIS — F322 Major depressive disorder, single episode, severe without psychotic features: Secondary | ICD-10-CM | POA: Diagnosis not present

## 2015-11-07 DIAGNOSIS — R1310 Dysphagia, unspecified: Secondary | ICD-10-CM | POA: Diagnosis not present

## 2015-11-07 DIAGNOSIS — K219 Gastro-esophageal reflux disease without esophagitis: Secondary | ICD-10-CM | POA: Diagnosis not present

## 2015-12-11 DIAGNOSIS — D2271 Melanocytic nevi of right lower limb, including hip: Secondary | ICD-10-CM | POA: Diagnosis not present

## 2015-12-11 DIAGNOSIS — D225 Melanocytic nevi of trunk: Secondary | ICD-10-CM | POA: Diagnosis not present

## 2015-12-11 DIAGNOSIS — L821 Other seborrheic keratosis: Secondary | ICD-10-CM | POA: Diagnosis not present

## 2015-12-11 DIAGNOSIS — D0461 Carcinoma in situ of skin of right upper limb, including shoulder: Secondary | ICD-10-CM | POA: Diagnosis not present

## 2015-12-11 DIAGNOSIS — Z85828 Personal history of other malignant neoplasm of skin: Secondary | ICD-10-CM | POA: Diagnosis not present

## 2015-12-11 DIAGNOSIS — Z23 Encounter for immunization: Secondary | ICD-10-CM | POA: Diagnosis not present

## 2015-12-11 DIAGNOSIS — D485 Neoplasm of uncertain behavior of skin: Secondary | ICD-10-CM | POA: Diagnosis not present

## 2016-01-03 DIAGNOSIS — D0461 Carcinoma in situ of skin of right upper limb, including shoulder: Secondary | ICD-10-CM | POA: Diagnosis not present

## 2016-01-07 DIAGNOSIS — K449 Diaphragmatic hernia without obstruction or gangrene: Secondary | ICD-10-CM | POA: Diagnosis not present

## 2016-01-07 DIAGNOSIS — K219 Gastro-esophageal reflux disease without esophagitis: Secondary | ICD-10-CM | POA: Diagnosis not present

## 2016-01-07 DIAGNOSIS — R197 Diarrhea, unspecified: Secondary | ICD-10-CM | POA: Diagnosis not present

## 2016-01-17 DIAGNOSIS — Z23 Encounter for immunization: Secondary | ICD-10-CM | POA: Diagnosis not present

## 2016-01-24 DIAGNOSIS — N390 Urinary tract infection, site not specified: Secondary | ICD-10-CM | POA: Diagnosis not present

## 2016-01-24 DIAGNOSIS — I10 Essential (primary) hypertension: Secondary | ICD-10-CM | POA: Diagnosis not present

## 2016-01-31 DIAGNOSIS — J4520 Mild intermittent asthma, uncomplicated: Secondary | ICD-10-CM | POA: Diagnosis not present

## 2016-01-31 DIAGNOSIS — M159 Polyosteoarthritis, unspecified: Secondary | ICD-10-CM | POA: Diagnosis not present

## 2016-01-31 DIAGNOSIS — I10 Essential (primary) hypertension: Secondary | ICD-10-CM | POA: Diagnosis not present

## 2016-01-31 DIAGNOSIS — N390 Urinary tract infection, site not specified: Secondary | ICD-10-CM | POA: Diagnosis not present

## 2016-06-10 ENCOUNTER — Encounter: Payer: Self-pay | Admitting: Podiatry

## 2016-06-10 ENCOUNTER — Ambulatory Visit (INDEPENDENT_AMBULATORY_CARE_PROVIDER_SITE_OTHER): Payer: Medicare Other | Admitting: Podiatry

## 2016-06-10 DIAGNOSIS — B351 Tinea unguium: Secondary | ICD-10-CM | POA: Diagnosis not present

## 2016-06-10 DIAGNOSIS — M79676 Pain in unspecified toe(s): Secondary | ICD-10-CM | POA: Diagnosis not present

## 2016-06-10 NOTE — Progress Notes (Signed)
She presents today with a chief complaint of painful elongated toenails. She elected to have her toenails trimmed.  Objective: I have reviewed her past mental history medications allergies social history. She has severe hammertoe deformities bilateral toenails are thick yellow dystrophic with clinical mycotic pulses are palpable no open lesions or wounds.  Assessment: Pain in limb secondary to onychomycosis.  Plan: Debridement of toenails 1 through 5 bilateral service secondary to pain.

## 2016-06-19 DIAGNOSIS — L570 Actinic keratosis: Secondary | ICD-10-CM | POA: Diagnosis not present

## 2016-06-19 DIAGNOSIS — D2272 Melanocytic nevi of left lower limb, including hip: Secondary | ICD-10-CM | POA: Diagnosis not present

## 2016-06-19 DIAGNOSIS — Z23 Encounter for immunization: Secondary | ICD-10-CM | POA: Diagnosis not present

## 2016-06-19 DIAGNOSIS — L821 Other seborrheic keratosis: Secondary | ICD-10-CM | POA: Diagnosis not present

## 2016-06-19 DIAGNOSIS — D225 Melanocytic nevi of trunk: Secondary | ICD-10-CM | POA: Diagnosis not present

## 2016-06-19 DIAGNOSIS — Z85828 Personal history of other malignant neoplasm of skin: Secondary | ICD-10-CM | POA: Diagnosis not present

## 2016-06-26 DIAGNOSIS — N390 Urinary tract infection, site not specified: Secondary | ICD-10-CM | POA: Diagnosis not present

## 2016-06-26 DIAGNOSIS — M159 Polyosteoarthritis, unspecified: Secondary | ICD-10-CM | POA: Diagnosis not present

## 2016-06-26 DIAGNOSIS — I10 Essential (primary) hypertension: Secondary | ICD-10-CM | POA: Diagnosis not present

## 2016-07-03 DIAGNOSIS — K219 Gastro-esophageal reflux disease without esophagitis: Secondary | ICD-10-CM | POA: Diagnosis not present

## 2016-07-03 DIAGNOSIS — I129 Hypertensive chronic kidney disease with stage 1 through stage 4 chronic kidney disease, or unspecified chronic kidney disease: Secondary | ICD-10-CM | POA: Diagnosis not present

## 2016-07-03 DIAGNOSIS — M159 Polyosteoarthritis, unspecified: Secondary | ICD-10-CM | POA: Diagnosis not present

## 2016-07-08 DIAGNOSIS — H04123 Dry eye syndrome of bilateral lacrimal glands: Secondary | ICD-10-CM | POA: Diagnosis not present

## 2016-08-05 DIAGNOSIS — H353131 Nonexudative age-related macular degeneration, bilateral, early dry stage: Secondary | ICD-10-CM | POA: Diagnosis not present

## 2016-08-05 DIAGNOSIS — H40003 Preglaucoma, unspecified, bilateral: Secondary | ICD-10-CM | POA: Diagnosis not present

## 2016-08-05 DIAGNOSIS — H04123 Dry eye syndrome of bilateral lacrimal glands: Secondary | ICD-10-CM | POA: Diagnosis not present

## 2016-08-06 ENCOUNTER — Encounter: Payer: Self-pay | Admitting: Gynecology

## 2016-08-08 DIAGNOSIS — H6123 Impacted cerumen, bilateral: Secondary | ICD-10-CM | POA: Diagnosis not present

## 2016-09-09 ENCOUNTER — Ambulatory Visit: Payer: Medicare Other | Admitting: Podiatry

## 2016-09-09 DIAGNOSIS — H04123 Dry eye syndrome of bilateral lacrimal glands: Secondary | ICD-10-CM | POA: Diagnosis not present

## 2016-10-22 DIAGNOSIS — N390 Urinary tract infection, site not specified: Secondary | ICD-10-CM | POA: Diagnosis not present

## 2016-10-22 DIAGNOSIS — A499 Bacterial infection, unspecified: Secondary | ICD-10-CM | POA: Diagnosis not present

## 2016-12-04 DIAGNOSIS — M159 Polyosteoarthritis, unspecified: Secondary | ICD-10-CM | POA: Diagnosis not present

## 2016-12-04 DIAGNOSIS — N390 Urinary tract infection, site not specified: Secondary | ICD-10-CM | POA: Diagnosis not present

## 2016-12-04 DIAGNOSIS — I129 Hypertensive chronic kidney disease with stage 1 through stage 4 chronic kidney disease, or unspecified chronic kidney disease: Secondary | ICD-10-CM | POA: Diagnosis not present

## 2016-12-04 DIAGNOSIS — I10 Essential (primary) hypertension: Secondary | ICD-10-CM | POA: Diagnosis not present

## 2016-12-04 DIAGNOSIS — K219 Gastro-esophageal reflux disease without esophagitis: Secondary | ICD-10-CM | POA: Diagnosis not present

## 2016-12-04 DIAGNOSIS — Z0000 Encounter for general adult medical examination without abnormal findings: Secondary | ICD-10-CM | POA: Diagnosis not present

## 2016-12-11 DIAGNOSIS — K5730 Diverticulosis of large intestine without perforation or abscess without bleeding: Secondary | ICD-10-CM | POA: Diagnosis not present

## 2016-12-11 DIAGNOSIS — R002 Palpitations: Secondary | ICD-10-CM | POA: Diagnosis not present

## 2016-12-11 DIAGNOSIS — M810 Age-related osteoporosis without current pathological fracture: Secondary | ICD-10-CM | POA: Diagnosis not present

## 2016-12-11 DIAGNOSIS — S22000A Wedge compression fracture of unspecified thoracic vertebra, initial encounter for closed fracture: Secondary | ICD-10-CM | POA: Diagnosis not present

## 2016-12-11 DIAGNOSIS — K219 Gastro-esophageal reflux disease without esophagitis: Secondary | ICD-10-CM | POA: Diagnosis not present

## 2016-12-11 DIAGNOSIS — N390 Urinary tract infection, site not specified: Secondary | ICD-10-CM | POA: Diagnosis not present

## 2016-12-11 DIAGNOSIS — M159 Polyosteoarthritis, unspecified: Secondary | ICD-10-CM | POA: Diagnosis not present

## 2016-12-11 DIAGNOSIS — J4520 Mild intermittent asthma, uncomplicated: Secondary | ICD-10-CM | POA: Diagnosis not present

## 2016-12-11 DIAGNOSIS — M11232 Other chondrocalcinosis, left wrist: Secondary | ICD-10-CM | POA: Diagnosis not present

## 2016-12-11 DIAGNOSIS — I129 Hypertensive chronic kidney disease with stage 1 through stage 4 chronic kidney disease, or unspecified chronic kidney disease: Secondary | ICD-10-CM | POA: Diagnosis not present

## 2017-01-07 DIAGNOSIS — L570 Actinic keratosis: Secondary | ICD-10-CM | POA: Diagnosis not present

## 2017-01-07 DIAGNOSIS — Z23 Encounter for immunization: Secondary | ICD-10-CM | POA: Diagnosis not present

## 2017-01-07 DIAGNOSIS — L821 Other seborrheic keratosis: Secondary | ICD-10-CM | POA: Diagnosis not present

## 2017-01-07 DIAGNOSIS — D2271 Melanocytic nevi of right lower limb, including hip: Secondary | ICD-10-CM | POA: Diagnosis not present

## 2017-01-07 DIAGNOSIS — Z85828 Personal history of other malignant neoplasm of skin: Secondary | ICD-10-CM | POA: Diagnosis not present

## 2017-01-07 DIAGNOSIS — D225 Melanocytic nevi of trunk: Secondary | ICD-10-CM | POA: Diagnosis not present

## 2017-02-03 DIAGNOSIS — A499 Bacterial infection, unspecified: Secondary | ICD-10-CM | POA: Diagnosis not present

## 2017-02-03 DIAGNOSIS — N390 Urinary tract infection, site not specified: Secondary | ICD-10-CM | POA: Diagnosis not present

## 2017-02-17 DIAGNOSIS — A499 Bacterial infection, unspecified: Secondary | ICD-10-CM | POA: Diagnosis not present

## 2017-02-17 DIAGNOSIS — N390 Urinary tract infection, site not specified: Secondary | ICD-10-CM | POA: Diagnosis not present

## 2017-02-17 DIAGNOSIS — Z8719 Personal history of other diseases of the digestive system: Secondary | ICD-10-CM | POA: Diagnosis not present

## 2017-02-19 DIAGNOSIS — H353131 Nonexudative age-related macular degeneration, bilateral, early dry stage: Secondary | ICD-10-CM | POA: Diagnosis not present

## 2017-02-19 DIAGNOSIS — H40003 Preglaucoma, unspecified, bilateral: Secondary | ICD-10-CM | POA: Diagnosis not present

## 2017-03-10 DIAGNOSIS — N390 Urinary tract infection, site not specified: Secondary | ICD-10-CM | POA: Diagnosis not present

## 2017-03-10 DIAGNOSIS — M5442 Lumbago with sciatica, left side: Secondary | ICD-10-CM | POA: Diagnosis not present

## 2017-03-10 DIAGNOSIS — Z8719 Personal history of other diseases of the digestive system: Secondary | ICD-10-CM | POA: Diagnosis not present

## 2017-03-10 DIAGNOSIS — A499 Bacterial infection, unspecified: Secondary | ICD-10-CM | POA: Diagnosis not present

## 2017-03-28 DIAGNOSIS — Z23 Encounter for immunization: Secondary | ICD-10-CM | POA: Diagnosis not present

## 2017-04-24 DIAGNOSIS — N390 Urinary tract infection, site not specified: Secondary | ICD-10-CM | POA: Diagnosis not present

## 2017-04-24 DIAGNOSIS — R351 Nocturia: Secondary | ICD-10-CM | POA: Diagnosis not present

## 2017-04-30 DIAGNOSIS — R110 Nausea: Secondary | ICD-10-CM | POA: Diagnosis not present

## 2017-04-30 DIAGNOSIS — R351 Nocturia: Secondary | ICD-10-CM | POA: Diagnosis not present

## 2017-04-30 DIAGNOSIS — Z889 Allergy status to unspecified drugs, medicaments and biological substances status: Secondary | ICD-10-CM | POA: Diagnosis not present

## 2017-06-24 DIAGNOSIS — I10 Essential (primary) hypertension: Secondary | ICD-10-CM | POA: Diagnosis not present

## 2017-06-24 DIAGNOSIS — R1312 Dysphagia, oropharyngeal phase: Secondary | ICD-10-CM | POA: Diagnosis not present

## 2017-06-24 DIAGNOSIS — K449 Diaphragmatic hernia without obstruction or gangrene: Secondary | ICD-10-CM | POA: Diagnosis not present

## 2017-06-24 DIAGNOSIS — K219 Gastro-esophageal reflux disease without esophagitis: Secondary | ICD-10-CM | POA: Diagnosis not present

## 2017-08-20 DIAGNOSIS — H353131 Nonexudative age-related macular degeneration, bilateral, early dry stage: Secondary | ICD-10-CM | POA: Diagnosis not present

## 2017-08-25 DIAGNOSIS — D225 Melanocytic nevi of trunk: Secondary | ICD-10-CM | POA: Diagnosis not present

## 2017-08-25 DIAGNOSIS — L570 Actinic keratosis: Secondary | ICD-10-CM | POA: Diagnosis not present

## 2017-08-25 DIAGNOSIS — Z85828 Personal history of other malignant neoplasm of skin: Secondary | ICD-10-CM | POA: Diagnosis not present

## 2017-08-25 DIAGNOSIS — L821 Other seborrheic keratosis: Secondary | ICD-10-CM | POA: Diagnosis not present

## 2017-08-25 DIAGNOSIS — D2271 Melanocytic nevi of right lower limb, including hip: Secondary | ICD-10-CM | POA: Diagnosis not present

## 2017-09-08 ENCOUNTER — Ambulatory Visit: Payer: Medicare Other | Admitting: Sports Medicine

## 2017-09-13 ENCOUNTER — Emergency Department (HOSPITAL_COMMUNITY): Payer: Medicare Other

## 2017-09-13 ENCOUNTER — Other Ambulatory Visit: Payer: Self-pay

## 2017-09-13 ENCOUNTER — Emergency Department (HOSPITAL_COMMUNITY)
Admission: EM | Admit: 2017-09-13 | Discharge: 2017-09-14 | Disposition: A | Discharge status: Home / Self Care | Payer: Medicare Other | Attending: Emergency Medicine | Admitting: Emergency Medicine

## 2017-09-13 DIAGNOSIS — S199XXA Unspecified injury of neck, initial encounter: Secondary | ICD-10-CM | POA: Diagnosis not present

## 2017-09-13 DIAGNOSIS — I480 Paroxysmal atrial fibrillation: Secondary | ICD-10-CM | POA: Insufficient documentation

## 2017-09-13 DIAGNOSIS — R0789 Other chest pain: Secondary | ICD-10-CM | POA: Diagnosis not present

## 2017-09-13 DIAGNOSIS — S0990XA Unspecified injury of head, initial encounter: Secondary | ICD-10-CM | POA: Diagnosis not present

## 2017-09-13 DIAGNOSIS — R0902 Hypoxemia: Secondary | ICD-10-CM | POA: Diagnosis not present

## 2017-09-13 DIAGNOSIS — Y9301 Activity, walking, marching and hiking: Secondary | ICD-10-CM | POA: Diagnosis not present

## 2017-09-13 DIAGNOSIS — Y999 Unspecified external cause status: Secondary | ICD-10-CM | POA: Diagnosis not present

## 2017-09-13 DIAGNOSIS — W19XXXA Unspecified fall, initial encounter: Secondary | ICD-10-CM

## 2017-09-13 DIAGNOSIS — M5489 Other dorsalgia: Secondary | ICD-10-CM | POA: Diagnosis not present

## 2017-09-13 DIAGNOSIS — S3993XA Unspecified injury of pelvis, initial encounter: Secondary | ICD-10-CM | POA: Diagnosis not present

## 2017-09-13 DIAGNOSIS — S299XXA Unspecified injury of thorax, initial encounter: Secondary | ICD-10-CM | POA: Diagnosis not present

## 2017-09-13 DIAGNOSIS — W010XXA Fall on same level from slipping, tripping and stumbling without subsequent striking against object, initial encounter: Secondary | ICD-10-CM | POA: Insufficient documentation

## 2017-09-13 DIAGNOSIS — R0781 Pleurodynia: Secondary | ICD-10-CM | POA: Diagnosis not present

## 2017-09-13 DIAGNOSIS — I499 Cardiac arrhythmia, unspecified: Secondary | ICD-10-CM | POA: Diagnosis not present

## 2017-09-13 DIAGNOSIS — S3991XA Unspecified injury of abdomen, initial encounter: Secondary | ICD-10-CM | POA: Diagnosis not present

## 2017-09-13 DIAGNOSIS — R52 Pain, unspecified: Secondary | ICD-10-CM | POA: Diagnosis not present

## 2017-09-13 DIAGNOSIS — Y92009 Unspecified place in unspecified non-institutional (private) residence as the place of occurrence of the external cause: Secondary | ICD-10-CM | POA: Insufficient documentation

## 2017-09-13 LAB — CBC WITH DIFFERENTIAL/PLATELET
Abs Immature Granulocytes: 0.1 10*3/uL (ref 0.0–0.1)
BASOS ABS: 0 10*3/uL (ref 0.0–0.1)
Basophils Relative: 0 %
EOS PCT: 1 %
Eosinophils Absolute: 0.1 10*3/uL (ref 0.0–0.7)
HCT: 40 % (ref 36.0–46.0)
HEMOGLOBIN: 13.2 g/dL (ref 12.0–15.0)
Immature Granulocytes: 1 %
LYMPHS PCT: 11 %
Lymphs Abs: 1.1 10*3/uL (ref 0.7–4.0)
MCH: 31.1 pg (ref 26.0–34.0)
MCHC: 33 g/dL (ref 30.0–36.0)
MCV: 94.3 fL (ref 78.0–100.0)
Monocytes Absolute: 0.6 10*3/uL (ref 0.1–1.0)
Monocytes Relative: 6 %
Neutro Abs: 8.6 10*3/uL — ABNORMAL HIGH (ref 1.7–7.7)
Neutrophils Relative %: 81 %
Platelets: 234 10*3/uL (ref 150–400)
RBC: 4.24 MIL/uL (ref 3.87–5.11)
RDW: 14.2 % (ref 11.5–15.5)
WBC: 10.5 10*3/uL (ref 4.0–10.5)

## 2017-09-13 LAB — I-STAT TROPONIN, ED: TROPONIN I, POC: 0.06 ng/mL (ref 0.00–0.08)

## 2017-09-13 MED ORDER — ACETAMINOPHEN 325 MG PO TABS
650.0000 mg | ORAL_TABLET | Freq: Once | ORAL | Status: AC
  Administered 2017-09-13: 650 mg via ORAL
  Filled 2017-09-13: qty 2

## 2017-09-13 NOTE — ED Triage Notes (Signed)
Pt had unwitnessed fall, locking front door, fell backwards into chair/table. No LOC. Pt c/o lower rib cage pain with deep breaths. Tender to palpation, no deformity. back pain at baseline with hx of scoliosis. Pt has had severe chest pain over last two days, none today. 198/114 hx of HTN. HR 98 irregular, no hx of same. 96% room air. NAD

## 2017-09-13 NOTE — ED Notes (Signed)
Patient transported to CT 

## 2017-09-13 NOTE — ED Provider Notes (Signed)
[facility] EMERGENCY DEPARTMENT Provider Note   CSN: [id number] Arrival date & time: 09/13/17  2131     History   Chief Complaint Chief Complaint  Patient presents with  . Fall    HPI [name] is a 84 y.o. female.  The history is provided by the patient and a relative. No language interpreter was used.  Fall     [name] is a 84 y.o. female who presents to the Emergency Department complaining of fall. She is a resident of an independent living facility and presents for evaluation of injuries following a fall. She was going to lock her door when she began to fall backwards, landing on her back. She is unsure if she hit her head. She had to scoot across the floor on her back until she could reach a phone to call for assistance. She reports pain across her chest with moving as well as chronic pain in her neck and back. She does endorse intermittent chest pain over the last few days and states this is similar to prior episodes of chest pain and she is been told it is nothing. Denies any fevers, shortness of breath, nausea, vomiting, abdominal pain. She saw her family doctor a few months ago and all of her medications were discontinued except for losartan. She denies any history of atrial fibrillation and takes no blood thinners.  No past medical history on file.  There are no active problems to display for this patient.      OB History   None      Home Medications    Prior to Admission medications   Not on File    Family History No family history on file.  Social History Social History   Tobacco Use  . Smoking status: Not on file  Substance Use Topics  . Alcohol use: Not on file  . Drug use: Not on file     Allergies   Penicillins   Review of Systems Review of Systems  All other systems reviewed and are negative.    Physical Exam Updated Vital Signs BP (!) 222/113   Pulse (!) 101   Temp 98.5 F (36.9 C) (Oral)   Resp 19    Ht 4\' 8"  (1.422 m)   Wt 41.7 kg (92 lb)   SpO2 95%   BMI 20.63 kg/m   Physical Exam  Constitutional: She is oriented to person, place, and time. She appears well-developed and well-nourished.  HENT:  Head: Normocephalic and atraumatic.  Cardiovascular:  No murmur heard. irregularly irregular  Pulmonary/Chest: Effort normal and breath sounds normal. No respiratory distress. She exhibits no tenderness.  Abdominal: Soft. There is no tenderness. There is no rebound and no guarding.  Musculoskeletal: She exhibits no edema.  Nobody tenderness throughout the C, T, L spine or chest or pelvis but she does have pain across her chest with sitting up in the stretcher. There is mild tenderness over the right shoulder and right upper arm.  Neurological: She is alert and oriented to person, place, and time.  Five out of five strength in all four extremities with sensation to light touch intact in all four extremities  Skin: Skin is warm and dry.  Psychiatric: She has a normal mood and affect. Her behavior is normal.  Nursing note and vitals reviewed.    ED Treatments / Results  Labs (all labs ordered are listed, but only abnormal results are displayed) Labs Reviewed  COMPREHENSIVE METABOLIC PANEL - Abnormal; Notable  for the following components:      Result Value   Sodium 131 (*)    Chloride 95 (*)    Glucose, Bld 121 (*)    Calcium 8.8 (*)    Albumin 3.4 (*)    GFR calc non Af Amer 52 (*)    All other components within normal limits  CBC WITH DIFFERENTIAL/PLATELET - Abnormal; Notable for the following components:   Neutro Abs 8.6 (*)    All other components within normal limits  URINALYSIS, ROUTINE W REFLEX MICROSCOPIC - Abnormal; Notable for the following components:   Color, Urine STRAW (*)    Hgb urine dipstick SMALL (*)    Protein, ur 100 (*)    Bacteria, UA RARE (*)    All other components within normal limits  I-STAT TROPONIN, ED    EKG None  Radiology Dg Chest 1  View  Result Date: 09/13/2017 CLINICAL DATA:  Unwitnessed fall. EXAM: CHEST  1 VIEW COMPARISON:  Lumbar spine radiographs 05/30/2015 FINDINGS: Cardiomegaly with tortuous atherosclerotic aorta. No acute pulmonary consolidation, effusion or pneumothorax. Elevated left hemidiaphragm with atelectasis at the left lung base. No pulmonary edema. Osteoarthritis of included right glenohumeral joint and left AC joint. Cholecystectomy clips in the right upper quadrant. Thoracolumbar spondylosis with chronic compression deformity at the thoracolumbar junction. Lumbar degenerative disc disease. IMPRESSION: Cardiomegaly with tortuous atherosclerotic aorta. Left basilar atelectasis. Electronically Signed   By: Ashley Royalty M.D.   On: 09/13/2017 23:05   Ct Head Wo Contrast  Result Date: 09/13/2017 CLINICAL DATA:  84 year old female with fall. EXAM: CT HEAD WITHOUT CONTRAST CT CERVICAL SPINE WITHOUT CONTRAST TECHNIQUE: Multidetector CT imaging of the head and cervical spine was performed following the standard protocol without intravenous contrast. Multiplanar CT image reconstructions of the cervical spine were also generated. COMPARISON:  None. FINDINGS: CT HEAD FINDINGS Brain: Moderate to advanced age-related atrophy and chronic microvascular ischemic changes. Small bilateral basal ganglia old lacunar infarcts noted. Areas of old infarct and encephalomalacia noted along the inferior left cerebellar hemisphere. There is no acute intracranial hemorrhage. No mass effect or midline shift. No extra-axial fluid collection. Vascular: No hyperdense vessel or unexpected calcification. Skull: Normal. Negative for fracture or focal lesion. Sinuses/Orbits: Mild mucoperiosteal thickening of paranasal sinuses. No air-fluid level. The mastoid air cells are clear. Other: None CT CERVICAL SPINE FINDINGS Alignment: No acute subluxation. Grade 1 C2-C3 and C4-C5 and C6-C7 anterolisthesis. Skull base and vertebrae: No acute fracture. Osteopenia  with bony fusion of the C3-C4, and C5-C6 vertebral bodies. Soft tissues and spinal canal: No prevertebral fluid or swelling. No visible canal hematoma. Disc levels: Multilevel degenerative changes with disc desiccation and vacuum phenomena. Upper chest: Biapical subpleural scarring. Other: Bilateral carotid bulb calcified plaques. IMPRESSION: 1. No acute intracranial hemorrhage. 2. Age-related atrophy and chronic microvascular ischemic changes. Areas of old infarct along the inferior left cerebellar hemisphere. 3. No acute/traumatic cervical spine pathology. Osteopenia with degenerative changes. Electronically Signed   By: Anner Crete M.D.   On: 09/13/2017 23:55   Ct Cervical Spine Wo Contrast  Result Date: 09/13/2017 CLINICAL DATA:  84 year old female with fall. EXAM: CT HEAD WITHOUT CONTRAST CT CERVICAL SPINE WITHOUT CONTRAST TECHNIQUE: Multidetector CT imaging of the head and cervical spine was performed following the standard protocol without intravenous contrast. Multiplanar CT image reconstructions of the cervical spine were also generated. COMPARISON:  None. FINDINGS: CT HEAD FINDINGS Brain: Moderate to advanced age-related atrophy and chronic microvascular ischemic changes. Small bilateral basal ganglia old lacunar infarcts noted.  Areas of old infarct and encephalomalacia noted along the inferior left cerebellar hemisphere. There is no acute intracranial hemorrhage. No mass effect or midline shift. No extra-axial fluid collection. Vascular: No hyperdense vessel or unexpected calcification. Skull: Normal. Negative for fracture or focal lesion. Sinuses/Orbits: Mild mucoperiosteal thickening of paranasal sinuses. No air-fluid level. The mastoid air cells are clear. Other: None CT CERVICAL SPINE FINDINGS Alignment: No acute subluxation. Grade 1 C2-C3 and C4-C5 and C6-C7 anterolisthesis. Skull base and vertebrae: No acute fracture. Osteopenia with bony fusion of the C3-C4, and C5-C6 vertebral bodies.  Soft tissues and spinal canal: No prevertebral fluid or swelling. No visible canal hematoma. Disc levels: Multilevel degenerative changes with disc desiccation and vacuum phenomena. Upper chest: Biapical subpleural scarring. Other: Bilateral carotid bulb calcified plaques. IMPRESSION: 1. No acute intracranial hemorrhage. 2. Age-related atrophy and chronic microvascular ischemic changes. Areas of old infarct along the inferior left cerebellar hemisphere. 3. No acute/traumatic cervical spine pathology. Osteopenia with degenerative changes. Electronically Signed   By: Anner Crete M.D.   On: 09/13/2017 23:55    Procedures Procedures (including critical care time)  Medications Ordered in ED Medications  acetaminophen (TYLENOL) tablet 650 mg (650 mg Oral Given 09/13/17 2310)  metoprolol tartrate (LOPRESSOR) injection 2.5 mg (2.5 mg Intravenous Given 09/14/17 0029)     Initial Impression / Assessment and Plan / ED Course  I have reviewed the triage vital signs and the nursing notes.  Pertinent labs & imaging results that were available during my care of the patient were reviewed by me and considered in my medical decision making (see chart for details).     Patient with history of hypertension here for evaluation of injuries following a fall as well as two days of chest pain. She is noted to be in new onset atrial fibrillation. Her rates are between 80 and 100 in the department. She doesn't have bony tenderness on palpation but she does have pain with sitting up in the bed. Given reports of intermittent pain for two days preceding her fall plan to obtain CTA to rule out dissection/PE. Patient care transferred pending imaging.  Final Clinical Impressions(s) / ED Diagnoses   Final diagnoses:  None    ED Discharge Orders    None       Quintella Reichert, MD 09/14/17 0040

## 2017-09-13 NOTE — ED Notes (Signed)
Pt reminded a urine sample is requested. Reports feeling pressure but inability to pee.

## 2017-09-14 ENCOUNTER — Emergency Department (HOSPITAL_COMMUNITY): Payer: Medicare Other

## 2017-09-14 ENCOUNTER — Telehealth: Payer: Self-pay | Admitting: *Deleted

## 2017-09-14 DIAGNOSIS — S3991XA Unspecified injury of abdomen, initial encounter: Secondary | ICD-10-CM | POA: Diagnosis not present

## 2017-09-14 DIAGNOSIS — S3993XA Unspecified injury of pelvis, initial encounter: Secondary | ICD-10-CM | POA: Diagnosis not present

## 2017-09-14 DIAGNOSIS — R0781 Pleurodynia: Secondary | ICD-10-CM | POA: Diagnosis not present

## 2017-09-14 DIAGNOSIS — S299XXA Unspecified injury of thorax, initial encounter: Secondary | ICD-10-CM | POA: Diagnosis not present

## 2017-09-14 DIAGNOSIS — R0789 Other chest pain: Secondary | ICD-10-CM | POA: Diagnosis not present

## 2017-09-14 LAB — URINALYSIS, ROUTINE W REFLEX MICROSCOPIC
BILIRUBIN URINE: NEGATIVE
Glucose, UA: NEGATIVE mg/dL
Ketones, ur: NEGATIVE mg/dL
LEUKOCYTES UA: NEGATIVE
Nitrite: NEGATIVE
PH: 7 (ref 5.0–8.0)
Protein, ur: 100 mg/dL — AB
SPECIFIC GRAVITY, URINE: 1.005 (ref 1.005–1.030)

## 2017-09-14 LAB — COMPREHENSIVE METABOLIC PANEL
ALT: 14 U/L (ref 14–54)
ANION GAP: 12 (ref 5–15)
AST: 30 U/L (ref 15–41)
Albumin: 3.4 g/dL — ABNORMAL LOW (ref 3.5–5.0)
Alkaline Phosphatase: 81 U/L (ref 38–126)
BILIRUBIN TOTAL: 0.9 mg/dL (ref 0.3–1.2)
BUN: 12 mg/dL (ref 6–20)
CO2: 24 mmol/L (ref 22–32)
Calcium: 8.8 mg/dL — ABNORMAL LOW (ref 8.9–10.3)
Chloride: 95 mmol/L — ABNORMAL LOW (ref 101–111)
Creatinine, Ser: 0.91 mg/dL (ref 0.44–1.00)
GFR calc Af Amer: >60 mL/min (ref >60)
GFR, EST NON AFRICAN AMERICAN: 52 mL/min — AB (ref >60)
Glucose, Bld: 121 mg/dL — ABNORMAL HIGH (ref 65–99)
POTASSIUM: 3.7 mmol/L (ref 3.5–5.1)
Sodium: 131 mmol/L — ABNORMAL LOW (ref 135–145)
TOTAL PROTEIN: 6.7 g/dL (ref 6.5–8.1)

## 2017-09-14 MED ORDER — METOPROLOL TARTRATE 5 MG/5ML IV SOLN
2.5000 mg | Freq: Once | INTRAVENOUS | Status: AC
  Administered 2017-09-14: 2.5 mg via INTRAVENOUS
  Filled 2017-09-14: qty 5

## 2017-09-14 MED ORDER — IOPAMIDOL (ISOVUE-370) INJECTION 76%
INTRAVENOUS | Status: AC
  Filled 2017-09-14: qty 100

## 2017-09-14 MED ORDER — ACETAMINOPHEN 500 MG PO TABS: 1000.0000 mg | ORAL_TABLET | Freq: Three times a day (TID) | ORAL | 0 refills | Status: DC

## 2017-09-14 MED ORDER — IOPAMIDOL (ISOVUE-370) INJECTION 76%
100.0000 mL | Freq: Once | INTRAVENOUS | Status: AC | PRN
  Administered 2017-09-14: 100 mL via INTRAVENOUS

## 2017-09-14 NOTE — Telephone Encounter (Signed)
Makenli Derstine J. Clydene Laming, RN, BSN, [employer] [phone] Spoke with pt via telephone regarding discharge planning for [facility]. Offered pt list of home health agencies to choose from.  Pt chose [facility] to render services. Christa See, RN of [facility]  notified. Patient made aware that [facility]  will be in contact in 24-48 hours.  No DME needs identified at this time.

## 2017-09-14 NOTE — ED Provider Notes (Signed)
I assumed care of this patient from Dr. Ralene Bathe at 0000.  Please see their note for further details of Hx, PE.  Briefly patient is a 84 y.o. female who presented after a mechanical fall resulting in chest wall pain; noted to be in new onset a fib. CT head and neck negative. Pending CTA.  Repeat EKG now in sinus rhythm.  Patient appears to have paroxysmal A. fib.  I discussed the case with on-call cardiology due to chads 2 score of 3, but given her advanced age and risk of fall, they agreed that anticoagulation would not be beneficial.  CTA without acute injuries, no evidence of PE or dissection.  Patient was able to ambulate with a walker on her own without significant complication.  We will put a referral for A. fib clinic.  We will also put in referral for home health.  The patient is safe for discharge with strict return precautions.  Disposition: Discharge  Condition: Good  I have discussed the results, Dx and Tx plan with the patient and daughter who expressed understanding and agree(s) with the plan. Discharge instructions discussed at great length. The patient and daughter were given strict return precautions who verbalized understanding of the instructions. No further questions at time of discharge.    ED Discharge Orders        Ordered    Amb Referral to AFIB Clinic     09/14/17 0510    acetaminophen (TYLENOL) 500 MG tablet  Every 8 hours     09/14/17 0529       Follow Up: Atrial fibrillation clinic [phone] Schedule an appointment as soon as possible for a visit  For close follow up to assess for paroxysmal atrial fibrillation  Primary care provider   If you do not have a primary care physician, contact [facility] at [phone] for referral      Fatima Blank, MD 09/14/17 [phone]

## 2017-09-14 NOTE — ED Notes (Signed)
Pt ambulated steadily in hall with walker. No complaints of pain or dizziness.

## 2017-09-14 NOTE — ED Notes (Signed)
Patient transported to CT 

## 2017-09-15 ENCOUNTER — Ambulatory Visit (HOSPITAL_COMMUNITY): Payer: Medicare Other | Admitting: Nurse Practitioner

## 2017-09-16 ENCOUNTER — Telehealth: Payer: Self-pay | Admitting: *Deleted

## 2017-09-16 DIAGNOSIS — I071 Rheumatic tricuspid insufficiency: Secondary | ICD-10-CM | POA: Diagnosis not present

## 2017-09-16 DIAGNOSIS — I480 Paroxysmal atrial fibrillation: Secondary | ICD-10-CM | POA: Diagnosis not present

## 2017-09-16 DIAGNOSIS — K5730 Diverticulosis of large intestine without perforation or abscess without bleeding: Secondary | ICD-10-CM | POA: Diagnosis not present

## 2017-09-16 DIAGNOSIS — M47812 Spondylosis without myelopathy or radiculopathy, cervical region: Secondary | ICD-10-CM | POA: Diagnosis not present

## 2017-09-16 DIAGNOSIS — I119 Hypertensive heart disease without heart failure: Secondary | ICD-10-CM | POA: Diagnosis not present

## 2017-09-16 DIAGNOSIS — R0781 Pleurodynia: Secondary | ICD-10-CM | POA: Diagnosis not present

## 2017-09-16 NOTE — Telephone Encounter (Signed)
Pt daughter Margaretha Sheffield) called stating [facility] has not visited pt for [facility] services as of yet.  Margaretha Sheffield states she has called company and been promised a visit but still no visit.  Margaretha Sheffield is very frustrated and willing to change [place] if no resolve from [facility] .  [place] contacted Gustine (liaison for [facility] ) to inquire about pt home visit.  Tiffany will research problem and contact pt promptly.

## 2017-09-17 ENCOUNTER — Encounter (HOSPITAL_COMMUNITY): Payer: Self-pay | Admitting: Nurse Practitioner

## 2017-09-17 ENCOUNTER — Ambulatory Visit (HOSPITAL_COMMUNITY)
Admission: RE | Admit: 2017-09-17 | Discharge: 2017-09-17 | Disposition: A | Discharge status: Home / Self Care | Payer: Medicare Other | Source: Ambulatory Visit | Attending: Nurse Practitioner | Admitting: Nurse Practitioner

## 2017-09-17 VITALS — BP 182/86 | HR 96 | Ht <= 58 in | Wt 89.0 lb

## 2017-09-17 DIAGNOSIS — Z79899 Other long term (current) drug therapy: Secondary | ICD-10-CM | POA: Insufficient documentation

## 2017-09-17 DIAGNOSIS — I10 Essential (primary) hypertension: Secondary | ICD-10-CM | POA: Insufficient documentation

## 2017-09-17 DIAGNOSIS — Z8249 Family history of ischemic heart disease and other diseases of the circulatory system: Secondary | ICD-10-CM | POA: Insufficient documentation

## 2017-09-17 DIAGNOSIS — K219 Gastro-esophageal reflux disease without esophagitis: Secondary | ICD-10-CM | POA: Diagnosis not present

## 2017-09-17 DIAGNOSIS — Z90710 Acquired absence of both cervix and uterus: Secondary | ICD-10-CM | POA: Insufficient documentation

## 2017-09-17 DIAGNOSIS — Z7982 Long term (current) use of aspirin: Secondary | ICD-10-CM | POA: Insufficient documentation

## 2017-09-17 DIAGNOSIS — Z825 Family history of asthma and other chronic lower respiratory diseases: Secondary | ICD-10-CM | POA: Diagnosis not present

## 2017-09-17 DIAGNOSIS — Z8673 Personal history of transient ischemic attack (TIA), and cerebral infarction without residual deficits: Secondary | ICD-10-CM | POA: Diagnosis not present

## 2017-09-17 DIAGNOSIS — Z800 Family history of malignant neoplasm of digestive organs: Secondary | ICD-10-CM | POA: Insufficient documentation

## 2017-09-17 DIAGNOSIS — Z9181 History of falling: Secondary | ICD-10-CM | POA: Insufficient documentation

## 2017-09-17 DIAGNOSIS — I480 Paroxysmal atrial fibrillation: Secondary | ICD-10-CM | POA: Diagnosis not present

## 2017-09-17 DIAGNOSIS — Z9049 Acquired absence of other specified parts of digestive tract: Secondary | ICD-10-CM | POA: Insufficient documentation

## 2017-09-17 DIAGNOSIS — Z96649 Presence of unspecified artificial hip joint: Secondary | ICD-10-CM | POA: Insufficient documentation

## 2017-09-17 NOTE — Progress Notes (Signed)
Primary Care Physician: Armanda Heritage, NP Referring Physician: [facility] ER    [name] is a 84 y.o. female with a h/o HTN, valvular disease, that recently had a mechanical fall at her nursing facility and was incidentally found to be in paroxysmal afib while in ER with controlled v rates. She was in SR on d/c. Her BP was elevated and received lopressor IV for that. Due to advanced age and fall risk, she was not determined to be an anticoagulation candidate.  IN the afib clinic, she remains in [place]. She is quite alert and talkative. No further falls.BP initially elevated here but on recheck 140/80. A home health nurse is seeing her now and per her daughter BP has been around 702 systolic at home. She recently had BP meds reduced by PCP. No alcohol or significant caffeine intake. No tobacco.  Today, she denies symptoms of palpitations, chest pain, shortness of breath, orthopnea, PND, lower extremity edema, dizziness, presyncope, syncope, or neurologic sequela. The patient is tolerating medications without difficulties and is otherwise without complaint today.   Past Medical History:  Diagnosis Date  . Acid reflux   . Cervical dysplasia   . Cystocele   . Heart valve disorder    "leaking"  . Hypertension   . Osteoporosis   . Rectocele   . Stroke [facility])    mild strokes  . Urinary incontinence    Past Surgical History:  Procedure Laterality Date  . APPENDECTOMY    . BLADDER SUSPENSION    . CHOLECYSTECTOMY  2003  . COLPOSCOPY    . INGUINAL HERNIA REPAIR    . PROLAPSED INTESTINES    . SURGERY FOR ADHESIONS RELATED TO INTESTINES    . TOTAL ABDOMINAL HYSTERECTOMY  1991   TAH, A/P REPAIR  . TOTAL HIP ARTHROPLASTY     X2    Current Outpatient Medications  Medication Sig Dispense Refill  . acetaminophen (TYLENOL) 500 MG tablet Take 1 tablet (500 mg total) by mouth 3 (three) times daily. Take 1 tablet (500mg ) 3 times daily x 5 days, then every 6 hours as needed. (Patient taking  differently: Take 500 mg by mouth 2 (two) times daily. Take 1 tablet (500mg ) 3 times daily x 5 days, then every 6 hours as needed.) 30 tablet 0  . aspirin 81 MG tablet Take 1 tablet (81 mg total) by mouth See admin instructions. Every 3 days 30 tablet   . feeding supplement (BOOST / RESOURCE BREEZE) LIQD Take 1 Container by mouth 3 (three) times daily between meals.  0  . losartan (COZAAR) 100 MG tablet Take 100 mg by mouth daily.    Marland Kitchen omeprazole (PRILOSEC) 20 MG capsule Take 1 capsule (20 mg total) by mouth daily. (Patient taking differently: Take 20 mg by mouth 2 (two) times daily before a meal. ) 30 capsule 0  . Polyvinyl Alcohol-Povidone (REFRESH OP) Place 1 drop into both eyes daily. Take every day per patient    . pramoxine (PROCTOFOAM) 1 % foam Place rectally 3 (three) times daily as needed for itching. 15 g 0  . trimethoprim (TRIMPEX) 100 MG tablet Take 100 mg by mouth at bedtime.  12   No current facility-administered medications for this encounter.       Social History   Socioeconomic History  . Marital status: Divorced    Spouse name: Not on file  . Number of children: Not on file  . Years of education: Not on file  . Highest education level: Not  on file  Occupational History  . Not on file  Social Needs  . Financial resource strain: Not on file  . Food insecurity:    Worry: Not on file    Inability: Not on file  . Transportation needs:    Medical: Not on file    Non-medical: Not on file  Tobacco Use  . Smoking status: Never Smoker  . Smokeless tobacco: Never Used  Substance and Sexual Activity  . Alcohol use: Yes    Alcohol/week: 2.4 oz    Types: 4 Standard drinks or equivalent per week  . Drug use: No  . Sexual activity: Never    Birth control/protection: Surgical  Lifestyle  . Physical activity:    Days per week: Not on file    Minutes per session: Not on file  . Stress: Not on file  Relationships  . Social connections:    Talks on phone: Not on file     Gets together: Not on file    Attends religious service: Not on file    Active member of club or organization: Not on file    Attends meetings of clubs or organizations: Not on file    Relationship status: Not on file  . Intimate partner violence:    Fear of current or ex partner: Not on file    Emotionally abused: Not on file    Physically abused: Not on file    Forced sexual activity: Not on file  Other Topics Concern  . Not on file  Social History Narrative  . Not on file    Family History  Problem Relation Age of Onset  . Cancer Mother        COLON  . Hypertension Father   . Heart disease Father   . Heart disease Sister   . Emphysema Brother     ROS- All systems are reviewed and negative except as per the HPI above  Physical Exam: Vitals:   09/17/17 1423  BP: (!) 182/86  Pulse: 96  Weight: 89 lb (40.4 kg)  Height: 4\' 8"  (1.422 m)   Wt Readings from Last 3 Encounters:  09/17/17 89 lb (40.4 kg)  09/13/17 92 lb (41.7 kg)  09/21/15 94 lb 2.2 oz (42.7 kg)    Labs: Lab Results  Component Value Date   NA 131 (L) 09/13/2017   K 3.7 09/13/2017   CL 95 (L) 09/13/2017   CO2 24 09/13/2017   GLUCOSE 121 (H) 09/13/2017   BUN 12 09/13/2017   CREATININE 0.91 09/13/2017   CALCIUM 8.8 (L) 09/13/2017   MG 1.7 09/23/2015   No results found for: INR No results found for: CHOL, HDL, LDLCALC, TRIG   GEN- The patient is well appearing, alert and oriented x 3 today.   Head- normocephalic, atraumatic Eyes-  Sclera clear, conjunctiva pink Ears- hearing intact Oropharynx- clear Neck- supple, no JVP Lymph- no cervical lymphadenopathy Lungs- Clear to ausculation bilaterally, normal work of breathing Heart- Regular rate and rhythm, no murmurs, rubs or gallops, PMI not laterally displaced GI- soft, NT, ND, + BS Extremities- no clubbing, cyanosis, or edema MS- no significant deformity or atrophy Skin- no rash or lesion Psych- euthymic mood, full affect Neuro- strength and  sensation are intact  EKG- SR with first degree AV block, at 96 bpm ER records reviewed    Assessment and Plan: 1. New dx of paroxysmal afib Pt has not experienced any palpitations  General education re afib She is not a candidate for anticoagulation  for  advanced age and fall risk Continue ASA at 81 mg daily  2. HTN Elevated on presentation but rechecked at 140/80 [place] is following at home  Continue losartan 100 mg daily   afib clinic as needed  Butch Penny C. Journei [place], [facility] [address] [place], [place] [address] [phone]

## 2017-09-18 DIAGNOSIS — M47812 Spondylosis without myelopathy or radiculopathy, cervical region: Secondary | ICD-10-CM | POA: Diagnosis not present

## 2017-09-18 DIAGNOSIS — K5730 Diverticulosis of large intestine without perforation or abscess without bleeding: Secondary | ICD-10-CM | POA: Diagnosis not present

## 2017-09-18 DIAGNOSIS — I480 Paroxysmal atrial fibrillation: Secondary | ICD-10-CM | POA: Diagnosis not present

## 2017-09-18 DIAGNOSIS — R0781 Pleurodynia: Secondary | ICD-10-CM | POA: Diagnosis not present

## 2017-09-18 DIAGNOSIS — I119 Hypertensive heart disease without heart failure: Secondary | ICD-10-CM | POA: Diagnosis not present

## 2017-09-18 DIAGNOSIS — I071 Rheumatic tricuspid insufficiency: Secondary | ICD-10-CM | POA: Diagnosis not present

## 2017-09-22 ENCOUNTER — Ambulatory Visit: Payer: Medicare Other | Admitting: Sports Medicine

## 2017-09-22 DIAGNOSIS — I480 Paroxysmal atrial fibrillation: Secondary | ICD-10-CM | POA: Diagnosis not present

## 2017-09-22 DIAGNOSIS — I071 Rheumatic tricuspid insufficiency: Secondary | ICD-10-CM | POA: Diagnosis not present

## 2017-09-22 DIAGNOSIS — M47812 Spondylosis without myelopathy or radiculopathy, cervical region: Secondary | ICD-10-CM | POA: Diagnosis not present

## 2017-09-22 DIAGNOSIS — R0781 Pleurodynia: Secondary | ICD-10-CM | POA: Diagnosis not present

## 2017-09-22 DIAGNOSIS — I119 Hypertensive heart disease without heart failure: Secondary | ICD-10-CM | POA: Diagnosis not present

## 2017-09-22 DIAGNOSIS — K5730 Diverticulosis of large intestine without perforation or abscess without bleeding: Secondary | ICD-10-CM | POA: Diagnosis not present

## 2017-09-25 DIAGNOSIS — I480 Paroxysmal atrial fibrillation: Secondary | ICD-10-CM | POA: Diagnosis not present

## 2017-09-25 DIAGNOSIS — M47812 Spondylosis without myelopathy or radiculopathy, cervical region: Secondary | ICD-10-CM | POA: Diagnosis not present

## 2017-09-25 DIAGNOSIS — R0781 Pleurodynia: Secondary | ICD-10-CM | POA: Diagnosis not present

## 2017-09-25 DIAGNOSIS — I119 Hypertensive heart disease without heart failure: Secondary | ICD-10-CM | POA: Diagnosis not present

## 2017-09-25 DIAGNOSIS — K5730 Diverticulosis of large intestine without perforation or abscess without bleeding: Secondary | ICD-10-CM | POA: Diagnosis not present

## 2017-09-25 DIAGNOSIS — I071 Rheumatic tricuspid insufficiency: Secondary | ICD-10-CM | POA: Diagnosis not present

## 2017-09-28 DIAGNOSIS — M47812 Spondylosis without myelopathy or radiculopathy, cervical region: Secondary | ICD-10-CM | POA: Diagnosis not present

## 2017-09-28 DIAGNOSIS — I119 Hypertensive heart disease without heart failure: Secondary | ICD-10-CM | POA: Diagnosis not present

## 2017-09-28 DIAGNOSIS — I071 Rheumatic tricuspid insufficiency: Secondary | ICD-10-CM | POA: Diagnosis not present

## 2017-09-28 DIAGNOSIS — K5730 Diverticulosis of large intestine without perforation or abscess without bleeding: Secondary | ICD-10-CM | POA: Diagnosis not present

## 2017-09-28 DIAGNOSIS — I480 Paroxysmal atrial fibrillation: Secondary | ICD-10-CM | POA: Diagnosis not present

## 2017-09-28 DIAGNOSIS — R0781 Pleurodynia: Secondary | ICD-10-CM | POA: Diagnosis not present

## 2017-09-29 DIAGNOSIS — R0781 Pleurodynia: Secondary | ICD-10-CM | POA: Diagnosis not present

## 2017-09-29 DIAGNOSIS — I119 Hypertensive heart disease without heart failure: Secondary | ICD-10-CM | POA: Diagnosis not present

## 2017-09-29 DIAGNOSIS — M47812 Spondylosis without myelopathy or radiculopathy, cervical region: Secondary | ICD-10-CM | POA: Diagnosis not present

## 2017-09-29 DIAGNOSIS — I071 Rheumatic tricuspid insufficiency: Secondary | ICD-10-CM | POA: Diagnosis not present

## 2017-09-29 DIAGNOSIS — K5730 Diverticulosis of large intestine without perforation or abscess without bleeding: Secondary | ICD-10-CM | POA: Diagnosis not present

## 2017-09-29 DIAGNOSIS — I480 Paroxysmal atrial fibrillation: Secondary | ICD-10-CM | POA: Diagnosis not present

## 2017-09-30 DIAGNOSIS — M47812 Spondylosis without myelopathy or radiculopathy, cervical region: Secondary | ICD-10-CM | POA: Diagnosis not present

## 2017-09-30 DIAGNOSIS — R0781 Pleurodynia: Secondary | ICD-10-CM | POA: Diagnosis not present

## 2017-09-30 DIAGNOSIS — I071 Rheumatic tricuspid insufficiency: Secondary | ICD-10-CM | POA: Diagnosis not present

## 2017-09-30 DIAGNOSIS — I119 Hypertensive heart disease without heart failure: Secondary | ICD-10-CM | POA: Diagnosis not present

## 2017-09-30 DIAGNOSIS — I480 Paroxysmal atrial fibrillation: Secondary | ICD-10-CM | POA: Diagnosis not present

## 2017-09-30 DIAGNOSIS — K5730 Diverticulosis of large intestine without perforation or abscess without bleeding: Secondary | ICD-10-CM | POA: Diagnosis not present

## 2017-10-01 DIAGNOSIS — I480 Paroxysmal atrial fibrillation: Secondary | ICD-10-CM | POA: Diagnosis not present

## 2017-10-01 DIAGNOSIS — M47812 Spondylosis without myelopathy or radiculopathy, cervical region: Secondary | ICD-10-CM | POA: Diagnosis not present

## 2017-10-01 DIAGNOSIS — R0781 Pleurodynia: Secondary | ICD-10-CM | POA: Diagnosis not present

## 2017-10-01 DIAGNOSIS — I119 Hypertensive heart disease without heart failure: Secondary | ICD-10-CM | POA: Diagnosis not present

## 2017-10-01 DIAGNOSIS — K5730 Diverticulosis of large intestine without perforation or abscess without bleeding: Secondary | ICD-10-CM | POA: Diagnosis not present

## 2017-10-01 DIAGNOSIS — I071 Rheumatic tricuspid insufficiency: Secondary | ICD-10-CM | POA: Diagnosis not present

## 2017-10-02 DIAGNOSIS — R0781 Pleurodynia: Secondary | ICD-10-CM | POA: Diagnosis not present

## 2017-10-02 DIAGNOSIS — I480 Paroxysmal atrial fibrillation: Secondary | ICD-10-CM | POA: Diagnosis not present

## 2017-10-02 DIAGNOSIS — K5730 Diverticulosis of large intestine without perforation or abscess without bleeding: Secondary | ICD-10-CM | POA: Diagnosis not present

## 2017-10-02 DIAGNOSIS — M47812 Spondylosis without myelopathy or radiculopathy, cervical region: Secondary | ICD-10-CM | POA: Diagnosis not present

## 2017-10-02 DIAGNOSIS — I071 Rheumatic tricuspid insufficiency: Secondary | ICD-10-CM | POA: Diagnosis not present

## 2017-10-02 DIAGNOSIS — I119 Hypertensive heart disease without heart failure: Secondary | ICD-10-CM | POA: Diagnosis not present

## 2017-10-05 DIAGNOSIS — I071 Rheumatic tricuspid insufficiency: Secondary | ICD-10-CM | POA: Diagnosis not present

## 2017-10-05 DIAGNOSIS — I119 Hypertensive heart disease without heart failure: Secondary | ICD-10-CM | POA: Diagnosis not present

## 2017-10-05 DIAGNOSIS — M47812 Spondylosis without myelopathy or radiculopathy, cervical region: Secondary | ICD-10-CM | POA: Diagnosis not present

## 2017-10-05 DIAGNOSIS — I480 Paroxysmal atrial fibrillation: Secondary | ICD-10-CM | POA: Diagnosis not present

## 2017-10-05 DIAGNOSIS — K5730 Diverticulosis of large intestine without perforation or abscess without bleeding: Secondary | ICD-10-CM | POA: Diagnosis not present

## 2017-10-05 DIAGNOSIS — R0781 Pleurodynia: Secondary | ICD-10-CM | POA: Diagnosis not present

## 2017-10-06 DIAGNOSIS — M47812 Spondylosis without myelopathy or radiculopathy, cervical region: Secondary | ICD-10-CM | POA: Diagnosis not present

## 2017-10-06 DIAGNOSIS — R0781 Pleurodynia: Secondary | ICD-10-CM | POA: Diagnosis not present

## 2017-10-06 DIAGNOSIS — I071 Rheumatic tricuspid insufficiency: Secondary | ICD-10-CM | POA: Diagnosis not present

## 2017-10-06 DIAGNOSIS — I480 Paroxysmal atrial fibrillation: Secondary | ICD-10-CM | POA: Diagnosis not present

## 2017-10-06 DIAGNOSIS — I119 Hypertensive heart disease without heart failure: Secondary | ICD-10-CM | POA: Diagnosis not present

## 2017-10-06 DIAGNOSIS — K5730 Diverticulosis of large intestine without perforation or abscess without bleeding: Secondary | ICD-10-CM | POA: Diagnosis not present

## 2017-10-07 DIAGNOSIS — I480 Paroxysmal atrial fibrillation: Secondary | ICD-10-CM | POA: Diagnosis not present

## 2017-10-07 DIAGNOSIS — I119 Hypertensive heart disease without heart failure: Secondary | ICD-10-CM | POA: Diagnosis not present

## 2017-10-07 DIAGNOSIS — K5730 Diverticulosis of large intestine without perforation or abscess without bleeding: Secondary | ICD-10-CM | POA: Diagnosis not present

## 2017-10-07 DIAGNOSIS — M47812 Spondylosis without myelopathy or radiculopathy, cervical region: Secondary | ICD-10-CM | POA: Diagnosis not present

## 2017-10-07 DIAGNOSIS — I071 Rheumatic tricuspid insufficiency: Secondary | ICD-10-CM | POA: Diagnosis not present

## 2017-10-07 DIAGNOSIS — R0781 Pleurodynia: Secondary | ICD-10-CM | POA: Diagnosis not present

## 2017-10-08 DIAGNOSIS — I480 Paroxysmal atrial fibrillation: Secondary | ICD-10-CM | POA: Diagnosis not present

## 2017-10-08 DIAGNOSIS — I071 Rheumatic tricuspid insufficiency: Secondary | ICD-10-CM | POA: Diagnosis not present

## 2017-10-08 DIAGNOSIS — K5730 Diverticulosis of large intestine without perforation or abscess without bleeding: Secondary | ICD-10-CM | POA: Diagnosis not present

## 2017-10-08 DIAGNOSIS — M47812 Spondylosis without myelopathy or radiculopathy, cervical region: Secondary | ICD-10-CM | POA: Diagnosis not present

## 2017-10-08 DIAGNOSIS — R0781 Pleurodynia: Secondary | ICD-10-CM | POA: Diagnosis not present

## 2017-10-08 DIAGNOSIS — I119 Hypertensive heart disease without heart failure: Secondary | ICD-10-CM | POA: Diagnosis not present

## 2017-10-12 DIAGNOSIS — I480 Paroxysmal atrial fibrillation: Secondary | ICD-10-CM | POA: Diagnosis not present

## 2017-10-12 DIAGNOSIS — I119 Hypertensive heart disease without heart failure: Secondary | ICD-10-CM | POA: Diagnosis not present

## 2017-10-12 DIAGNOSIS — M47812 Spondylosis without myelopathy or radiculopathy, cervical region: Secondary | ICD-10-CM | POA: Diagnosis not present

## 2017-10-12 DIAGNOSIS — K5730 Diverticulosis of large intestine without perforation or abscess without bleeding: Secondary | ICD-10-CM | POA: Diagnosis not present

## 2017-10-12 DIAGNOSIS — R0781 Pleurodynia: Secondary | ICD-10-CM | POA: Diagnosis not present

## 2017-10-12 DIAGNOSIS — I071 Rheumatic tricuspid insufficiency: Secondary | ICD-10-CM | POA: Diagnosis not present

## 2017-10-13 DIAGNOSIS — R0781 Pleurodynia: Secondary | ICD-10-CM | POA: Diagnosis not present

## 2017-10-13 DIAGNOSIS — I119 Hypertensive heart disease without heart failure: Secondary | ICD-10-CM | POA: Diagnosis not present

## 2017-10-13 DIAGNOSIS — K5730 Diverticulosis of large intestine without perforation or abscess without bleeding: Secondary | ICD-10-CM | POA: Diagnosis not present

## 2017-10-13 DIAGNOSIS — M47812 Spondylosis without myelopathy or radiculopathy, cervical region: Secondary | ICD-10-CM | POA: Diagnosis not present

## 2017-10-13 DIAGNOSIS — R32 Unspecified urinary incontinence: Secondary | ICD-10-CM | POA: Diagnosis not present

## 2017-10-13 DIAGNOSIS — I480 Paroxysmal atrial fibrillation: Secondary | ICD-10-CM | POA: Diagnosis not present

## 2017-10-13 DIAGNOSIS — M8580 Other specified disorders of bone density and structure, unspecified site: Secondary | ICD-10-CM | POA: Diagnosis not present

## 2017-10-13 DIAGNOSIS — I071 Rheumatic tricuspid insufficiency: Secondary | ICD-10-CM | POA: Diagnosis not present

## 2017-10-13 DIAGNOSIS — Z86711 Personal history of pulmonary embolism: Secondary | ICD-10-CM | POA: Diagnosis not present

## 2017-10-13 DIAGNOSIS — Z7982 Long term (current) use of aspirin: Secondary | ICD-10-CM | POA: Diagnosis not present

## 2017-10-15 DIAGNOSIS — I071 Rheumatic tricuspid insufficiency: Secondary | ICD-10-CM | POA: Diagnosis not present

## 2017-10-15 DIAGNOSIS — I119 Hypertensive heart disease without heart failure: Secondary | ICD-10-CM | POA: Diagnosis not present

## 2017-10-15 DIAGNOSIS — M47812 Spondylosis without myelopathy or radiculopathy, cervical region: Secondary | ICD-10-CM | POA: Diagnosis not present

## 2017-10-15 DIAGNOSIS — I480 Paroxysmal atrial fibrillation: Secondary | ICD-10-CM | POA: Diagnosis not present

## 2017-10-15 DIAGNOSIS — K5730 Diverticulosis of large intestine without perforation or abscess without bleeding: Secondary | ICD-10-CM | POA: Diagnosis not present

## 2017-10-15 DIAGNOSIS — R0781 Pleurodynia: Secondary | ICD-10-CM | POA: Diagnosis not present

## 2017-10-16 DIAGNOSIS — M47812 Spondylosis without myelopathy or radiculopathy, cervical region: Secondary | ICD-10-CM | POA: Diagnosis not present

## 2017-10-16 DIAGNOSIS — R0781 Pleurodynia: Secondary | ICD-10-CM | POA: Diagnosis not present

## 2017-10-16 DIAGNOSIS — I119 Hypertensive heart disease without heart failure: Secondary | ICD-10-CM | POA: Diagnosis not present

## 2017-10-16 DIAGNOSIS — I480 Paroxysmal atrial fibrillation: Secondary | ICD-10-CM | POA: Diagnosis not present

## 2017-10-16 DIAGNOSIS — I071 Rheumatic tricuspid insufficiency: Secondary | ICD-10-CM | POA: Diagnosis not present

## 2017-10-16 DIAGNOSIS — K5730 Diverticulosis of large intestine without perforation or abscess without bleeding: Secondary | ICD-10-CM | POA: Diagnosis not present

## 2017-10-20 DIAGNOSIS — R0781 Pleurodynia: Secondary | ICD-10-CM | POA: Diagnosis not present

## 2017-10-20 DIAGNOSIS — I480 Paroxysmal atrial fibrillation: Secondary | ICD-10-CM | POA: Diagnosis not present

## 2017-10-20 DIAGNOSIS — K5730 Diverticulosis of large intestine without perforation or abscess without bleeding: Secondary | ICD-10-CM | POA: Diagnosis not present

## 2017-10-20 DIAGNOSIS — I071 Rheumatic tricuspid insufficiency: Secondary | ICD-10-CM | POA: Diagnosis not present

## 2017-10-20 DIAGNOSIS — M47812 Spondylosis without myelopathy or radiculopathy, cervical region: Secondary | ICD-10-CM | POA: Diagnosis not present

## 2017-10-20 DIAGNOSIS — I119 Hypertensive heart disease without heart failure: Secondary | ICD-10-CM | POA: Diagnosis not present

## 2017-10-21 DIAGNOSIS — K5730 Diverticulosis of large intestine without perforation or abscess without bleeding: Secondary | ICD-10-CM | POA: Diagnosis not present

## 2017-10-21 DIAGNOSIS — I119 Hypertensive heart disease without heart failure: Secondary | ICD-10-CM | POA: Diagnosis not present

## 2017-10-21 DIAGNOSIS — M47812 Spondylosis without myelopathy or radiculopathy, cervical region: Secondary | ICD-10-CM | POA: Diagnosis not present

## 2017-10-21 DIAGNOSIS — I071 Rheumatic tricuspid insufficiency: Secondary | ICD-10-CM | POA: Diagnosis not present

## 2017-10-21 DIAGNOSIS — I480 Paroxysmal atrial fibrillation: Secondary | ICD-10-CM | POA: Diagnosis not present

## 2017-10-21 DIAGNOSIS — R0781 Pleurodynia: Secondary | ICD-10-CM | POA: Diagnosis not present

## 2017-10-22 DIAGNOSIS — K5730 Diverticulosis of large intestine without perforation or abscess without bleeding: Secondary | ICD-10-CM | POA: Diagnosis not present

## 2017-10-22 DIAGNOSIS — I480 Paroxysmal atrial fibrillation: Secondary | ICD-10-CM | POA: Diagnosis not present

## 2017-10-22 DIAGNOSIS — R0781 Pleurodynia: Secondary | ICD-10-CM | POA: Diagnosis not present

## 2017-10-22 DIAGNOSIS — I071 Rheumatic tricuspid insufficiency: Secondary | ICD-10-CM | POA: Diagnosis not present

## 2017-10-22 DIAGNOSIS — M47812 Spondylosis without myelopathy or radiculopathy, cervical region: Secondary | ICD-10-CM | POA: Diagnosis not present

## 2017-10-22 DIAGNOSIS — I119 Hypertensive heart disease without heart failure: Secondary | ICD-10-CM | POA: Diagnosis not present

## 2017-10-23 DIAGNOSIS — I480 Paroxysmal atrial fibrillation: Secondary | ICD-10-CM | POA: Diagnosis not present

## 2017-10-23 DIAGNOSIS — R0781 Pleurodynia: Secondary | ICD-10-CM | POA: Diagnosis not present

## 2017-10-23 DIAGNOSIS — K5730 Diverticulosis of large intestine without perforation or abscess without bleeding: Secondary | ICD-10-CM | POA: Diagnosis not present

## 2017-10-23 DIAGNOSIS — I071 Rheumatic tricuspid insufficiency: Secondary | ICD-10-CM | POA: Diagnosis not present

## 2017-10-23 DIAGNOSIS — I119 Hypertensive heart disease without heart failure: Secondary | ICD-10-CM | POA: Diagnosis not present

## 2017-10-23 DIAGNOSIS — M47812 Spondylosis without myelopathy or radiculopathy, cervical region: Secondary | ICD-10-CM | POA: Diagnosis not present

## 2017-11-26 DIAGNOSIS — E871 Hypo-osmolality and hyponatremia: Secondary | ICD-10-CM | POA: Diagnosis not present

## 2017-11-26 DIAGNOSIS — M159 Polyosteoarthritis, unspecified: Secondary | ICD-10-CM | POA: Diagnosis not present

## 2017-11-26 DIAGNOSIS — K219 Gastro-esophageal reflux disease without esophagitis: Secondary | ICD-10-CM | POA: Diagnosis not present

## 2017-11-26 DIAGNOSIS — K449 Diaphragmatic hernia without obstruction or gangrene: Secondary | ICD-10-CM | POA: Diagnosis not present

## 2017-11-26 DIAGNOSIS — R05 Cough: Secondary | ICD-10-CM | POA: Diagnosis not present

## 2017-11-26 DIAGNOSIS — R8271 Bacteriuria: Secondary | ICD-10-CM | POA: Diagnosis not present

## 2017-11-26 DIAGNOSIS — N183 Chronic kidney disease, stage 3 (moderate): Secondary | ICD-10-CM | POA: Diagnosis not present

## 2017-11-26 DIAGNOSIS — K210 Gastro-esophageal reflux disease with esophagitis: Secondary | ICD-10-CM | POA: Diagnosis not present

## 2017-11-26 DIAGNOSIS — I129 Hypertensive chronic kidney disease with stage 1 through stage 4 chronic kidney disease, or unspecified chronic kidney disease: Secondary | ICD-10-CM | POA: Diagnosis not present

## 2017-11-26 DIAGNOSIS — N390 Urinary tract infection, site not specified: Secondary | ICD-10-CM | POA: Diagnosis not present

## 2017-11-30 ENCOUNTER — Emergency Department (HOSPITAL_COMMUNITY): Payer: Medicare Other

## 2017-11-30 ENCOUNTER — Other Ambulatory Visit: Payer: Self-pay

## 2017-11-30 ENCOUNTER — Inpatient Hospital Stay (HOSPITAL_COMMUNITY)
Admission: EM | Admit: 2017-11-30 | Discharge: 2017-12-05 | DRG: 062 | Disposition: A | Discharge status: Hospice | Payer: Medicare Other | Attending: Neurology | Admitting: Neurology

## 2017-11-30 DIAGNOSIS — R404 Transient alteration of awareness: Secondary | ICD-10-CM | POA: Diagnosis not present

## 2017-11-30 DIAGNOSIS — I4891 Unspecified atrial fibrillation: Secondary | ICD-10-CM

## 2017-11-30 DIAGNOSIS — G8191 Hemiplegia, unspecified affecting right dominant side: Secondary | ICD-10-CM | POA: Diagnosis present

## 2017-11-30 DIAGNOSIS — I639 Cerebral infarction, unspecified: Secondary | ICD-10-CM | POA: Diagnosis present

## 2017-11-30 DIAGNOSIS — R279 Unspecified lack of coordination: Secondary | ICD-10-CM | POA: Diagnosis not present

## 2017-11-30 DIAGNOSIS — Z8673 Personal history of transient ischemic attack (TIA), and cerebral infarction without residual deficits: Secondary | ICD-10-CM

## 2017-11-30 DIAGNOSIS — Z96649 Presence of unspecified artificial hip joint: Secondary | ICD-10-CM | POA: Diagnosis present

## 2017-11-30 DIAGNOSIS — Z7982 Long term (current) use of aspirin: Secondary | ICD-10-CM

## 2017-11-30 DIAGNOSIS — Z90710 Acquired absence of both cervix and uterus: Secondary | ICD-10-CM

## 2017-11-30 DIAGNOSIS — I10 Essential (primary) hypertension: Secondary | ICD-10-CM | POA: Diagnosis present

## 2017-11-30 DIAGNOSIS — Z515 Encounter for palliative care: Secondary | ICD-10-CM | POA: Diagnosis not present

## 2017-11-30 DIAGNOSIS — R29714 NIHSS score 14: Secondary | ICD-10-CM | POA: Diagnosis present

## 2017-11-30 DIAGNOSIS — I481 Persistent atrial fibrillation: Secondary | ICD-10-CM | POA: Diagnosis not present

## 2017-11-30 DIAGNOSIS — I63512 Cerebral infarction due to unspecified occlusion or stenosis of left middle cerebral artery: Secondary | ICD-10-CM | POA: Diagnosis not present

## 2017-11-30 DIAGNOSIS — E871 Hypo-osmolality and hyponatremia: Secondary | ICD-10-CM | POA: Diagnosis present

## 2017-11-30 DIAGNOSIS — I63412 Cerebral infarction due to embolism of left middle cerebral artery: Secondary | ICD-10-CM | POA: Diagnosis not present

## 2017-11-30 DIAGNOSIS — R29810 Facial weakness: Secondary | ICD-10-CM | POA: Diagnosis present

## 2017-11-30 DIAGNOSIS — I351 Nonrheumatic aortic (valve) insufficiency: Secondary | ICD-10-CM | POA: Diagnosis not present

## 2017-11-30 DIAGNOSIS — K219 Gastro-esophageal reflux disease without esophagitis: Secondary | ICD-10-CM | POA: Diagnosis present

## 2017-11-30 DIAGNOSIS — Z9049 Acquired absence of other specified parts of digestive tract: Secondary | ICD-10-CM

## 2017-11-30 DIAGNOSIS — E785 Hyperlipidemia, unspecified: Secondary | ICD-10-CM | POA: Diagnosis present

## 2017-11-30 DIAGNOSIS — E876 Hypokalemia: Secondary | ICD-10-CM | POA: Diagnosis not present

## 2017-11-30 DIAGNOSIS — Z743 Need for continuous supervision: Secondary | ICD-10-CM | POA: Diagnosis not present

## 2017-11-30 DIAGNOSIS — I509 Heart failure, unspecified: Secondary | ICD-10-CM | POA: Diagnosis not present

## 2017-11-30 DIAGNOSIS — R29818 Other symptoms and signs involving the nervous system: Secondary | ICD-10-CM | POA: Diagnosis not present

## 2017-11-30 DIAGNOSIS — I480 Paroxysmal atrial fibrillation: Secondary | ICD-10-CM | POA: Diagnosis present

## 2017-11-30 DIAGNOSIS — R4701 Aphasia: Secondary | ICD-10-CM | POA: Diagnosis present

## 2017-11-30 DIAGNOSIS — G459 Transient cerebral ischemic attack, unspecified: Secondary | ICD-10-CM | POA: Diagnosis not present

## 2017-11-30 DIAGNOSIS — Z66 Do not resuscitate: Secondary | ICD-10-CM | POA: Diagnosis present

## 2017-11-30 DIAGNOSIS — R000 Tachycardia, unspecified: Secondary | ICD-10-CM | POA: Diagnosis not present

## 2017-11-30 DIAGNOSIS — M810 Age-related osteoporosis without current pathological fracture: Secondary | ICD-10-CM | POA: Diagnosis present

## 2017-11-30 DIAGNOSIS — I6523 Occlusion and stenosis of bilateral carotid arteries: Secondary | ICD-10-CM | POA: Diagnosis not present

## 2017-11-30 DIAGNOSIS — I482 Chronic atrial fibrillation: Secondary | ICD-10-CM | POA: Diagnosis not present

## 2017-11-30 DIAGNOSIS — D72829 Elevated white blood cell count, unspecified: Secondary | ICD-10-CM | POA: Diagnosis not present

## 2017-11-30 DIAGNOSIS — R410 Disorientation, unspecified: Secondary | ICD-10-CM | POA: Diagnosis not present

## 2017-11-30 DIAGNOSIS — G4489 Other headache syndrome: Secondary | ICD-10-CM | POA: Diagnosis not present

## 2017-11-30 LAB — APTT: aPTT: 28 seconds (ref 24–36)

## 2017-11-30 LAB — COMPREHENSIVE METABOLIC PANEL
ALK PHOS: 76 U/L (ref 38–126)
ALT: 14 U/L (ref 0–44)
AST: 30 U/L (ref 15–41)
Albumin: 3.4 g/dL — ABNORMAL LOW (ref 3.5–5.0)
Anion gap: 13 (ref 5–15)
BILIRUBIN TOTAL: 1.2 mg/dL (ref 0.3–1.2)
BUN: 24 mg/dL — ABNORMAL HIGH (ref 8–23)
CALCIUM: 8.7 mg/dL — AB (ref 8.9–10.3)
CO2: 25 mmol/L (ref 22–32)
CREATININE: 1.02 mg/dL — AB (ref 0.44–1.00)
Chloride: 90 mmol/L — ABNORMAL LOW (ref 98–111)
GFR calc Af Amer: 52 mL/min — ABNORMAL LOW (ref >60)
GFR, EST NON AFRICAN AMERICAN: 45 mL/min — AB (ref >60)
Glucose, Bld: 97 mg/dL (ref 70–99)
Potassium: 3.7 mmol/L (ref 3.5–5.1)
Sodium: 128 mmol/L — ABNORMAL LOW (ref 135–145)
TOTAL PROTEIN: 6.5 g/dL (ref 6.5–8.1)

## 2017-11-30 LAB — DIFFERENTIAL
ABS IMMATURE GRANULOCYTES: 0 10*3/uL (ref 0.0–0.1)
Basophils Absolute: 0 10*3/uL (ref 0.0–0.1)
Basophils Relative: 0 %
EOS ABS: 0 10*3/uL (ref 0.0–0.7)
Eosinophils Relative: 0 %
Immature Granulocytes: 0 %
LYMPHS ABS: 2.3 10*3/uL (ref 0.7–4.0)
LYMPHS PCT: 20 %
MONOS PCT: 8 %
Monocytes Absolute: 0.9 10*3/uL (ref 0.1–1.0)
NEUTROS ABS: 8.1 10*3/uL — AB (ref 1.7–7.7)
Neutrophils Relative %: 72 %

## 2017-11-30 LAB — CBC
HCT: 39.2 % (ref 36.0–46.0)
Hemoglobin: 13.3 g/dL (ref 12.0–15.0)
MCH: 32 pg (ref 26.0–34.0)
MCHC: 33.9 g/dL (ref 30.0–36.0)
MCV: 94.5 fL (ref 78.0–100.0)
PLATELETS: 328 10*3/uL (ref 150–400)
RBC: 4.15 MIL/uL (ref 3.87–5.11)
RDW: 13.7 % (ref 11.5–15.5)
WBC: 11.3 10*3/uL — ABNORMAL HIGH (ref 4.0–10.5)

## 2017-11-30 LAB — I-STAT TROPONIN, ED: TROPONIN I, POC: 0.11 ng/mL — AB (ref 0.00–0.08)

## 2017-11-30 LAB — I-STAT CHEM 8, ED
BUN: 26 mg/dL — ABNORMAL HIGH (ref 8–23)
CREATININE: 0.9 mg/dL (ref 0.44–1.00)
Calcium, Ion: 1.03 mmol/L — ABNORMAL LOW (ref 1.15–1.40)
Chloride: 91 mmol/L — ABNORMAL LOW (ref 98–111)
Glucose, Bld: 105 mg/dL — ABNORMAL HIGH (ref 70–99)
HEMATOCRIT: 41 % (ref 36.0–46.0)
HEMOGLOBIN: 13.9 g/dL (ref 12.0–15.0)
Potassium: 3.5 mmol/L (ref 3.5–5.1)
SODIUM: 126 mmol/L — AB (ref 135–145)
TCO2: 24 mmol/L (ref 22–32)

## 2017-11-30 LAB — ETHANOL

## 2017-11-30 LAB — URINALYSIS, COMPLETE (UACMP) WITH MICROSCOPIC
BILIRUBIN URINE: NEGATIVE
Glucose, UA: NEGATIVE mg/dL
KETONES UR: NEGATIVE mg/dL
Nitrite: NEGATIVE
PH: 5 (ref 5.0–8.0)
Protein, ur: 30 mg/dL — AB
Specific Gravity, Urine: 1.036 — ABNORMAL HIGH (ref 1.005–1.030)

## 2017-11-30 LAB — RAPID URINE DRUG SCREEN, HOSP PERFORMED
Amphetamines: NOT DETECTED
Barbiturates: NOT DETECTED
Benzodiazepines: NOT DETECTED
Cocaine: NOT DETECTED
OPIATES: NOT DETECTED
Tetrahydrocannabinol: NOT DETECTED

## 2017-11-30 LAB — CBG MONITORING, ED: Glucose-Capillary: 104 mg/dL — ABNORMAL HIGH (ref 70–99)

## 2017-11-30 LAB — PROTIME-INR
INR: 1.07
Prothrombin Time: 13.8 seconds (ref 11.4–15.2)

## 2017-11-30 MED ORDER — CLEVIDIPINE BUTYRATE 0.5 MG/ML IV EMUL
INTRAVENOUS | Status: AC
  Filled 2017-11-30: qty 50

## 2017-11-30 MED ORDER — LABETALOL HCL 5 MG/ML IV SOLN
20.0000 mg | Freq: Once | INTRAVENOUS | Status: AC
  Administered 2017-11-30: 20 mg via INTRAVENOUS

## 2017-11-30 MED ORDER — SODIUM CHLORIDE 0.9 % IV SOLN
50.0000 mL | Freq: Once | INTRAVENOUS | Status: AC
  Administered 2017-11-30: 50 mL via INTRAVENOUS

## 2017-11-30 MED ORDER — IOPAMIDOL (ISOVUE-370) INJECTION 76%
50.0000 mL | Freq: Once | INTRAVENOUS | Status: AC | PRN
  Administered 2017-11-30: 50 mL via INTRAVENOUS

## 2017-11-30 MED ORDER — STROKE: EARLY STAGES OF RECOVERY BOOK
Freq: Once | Status: DC
  Filled 2017-11-30: qty 1

## 2017-11-30 MED ORDER — ALTEPLASE (STROKE) FULL DOSE INFUSION
0.9000 mg/kg | Freq: Once | INTRAVENOUS | Status: AC
  Administered 2017-11-30: 36.9 mg via INTRAVENOUS
  Filled 2017-11-30: qty 100

## 2017-11-30 MED ORDER — CLEVIDIPINE BUTYRATE 0.5 MG/ML IV EMUL
0.0000 mg/h | INTRAVENOUS | Status: DC
  Administered 2017-11-30 – 2017-12-01 (×3): 1 mg/h via INTRAVENOUS
  Administered 2017-12-02: 4 mg/h via INTRAVENOUS
  Filled 2017-11-30 (×2): qty 50

## 2017-11-30 MED ORDER — CLEVIDIPINE BUTYRATE 0.5 MG/ML IV EMUL: 0.0000 mg/h | INTRAVENOUS | Status: DC

## 2017-11-30 MED ORDER — SODIUM CHLORIDE 0.9 % IV SOLN
INTRAVENOUS | Status: DC
  Administered 2017-12-01: 18:00:00 via INTRAVENOUS

## 2017-11-30 MED ORDER — ACETAMINOPHEN 650 MG RE SUPP: 650.0000 mg | RECTAL | Status: DC | PRN

## 2017-11-30 MED ORDER — FAMOTIDINE IN NACL 20-0.9 MG/50ML-% IV SOLN
20.0000 mg | Freq: Every day | INTRAVENOUS | Status: DC
  Administered 2017-12-01: 20 mg via INTRAVENOUS
  Filled 2017-11-30: qty 50

## 2017-11-30 MED ORDER — ACETAMINOPHEN 325 MG PO TABS
650.0000 mg | ORAL_TABLET | ORAL | Status: DC | PRN
  Administered 2017-12-03: 650 mg via ORAL
  Filled 2017-11-30: qty 2

## 2017-11-30 MED ORDER — ACETAMINOPHEN 160 MG/5ML PO SOLN: 650.0000 mg | ORAL | Status: DC | PRN

## 2017-11-30 NOTE — ED Notes (Signed)
BP now 171/103. No need to titrate cleviprex infusion.

## 2017-11-30 NOTE — ED Notes (Signed)
BP 173/106. Celviprex drip restarted at 1820 at 1mg /hour.

## 2017-11-30 NOTE — ED Notes (Signed)
ED Provider at bedside. 

## 2017-11-30 NOTE — ED Notes (Signed)
This RN transported patient to her room C24.

## 2017-11-30 NOTE — ED Notes (Signed)
Neurology MD made aware of patient's BP 203/93 at 1738. Given verbal order for 20mg  labetolol. Patient given labetolol at 1740.

## 2017-11-30 NOTE — ED Triage Notes (Addendum)
Patient here from [place] independent living at nursing facility after falling this afternoon and developing stroke like symptoms. Per EMS, patient became unsteady on her feet while walking, fell down, and exhibited slurred speech, expressive aphasia, and left sided gaze when staff found her. Patient is able to speak normally and participate in ADLs normally at baseline. LKW 1515. CBG 110 for EMS. BP 848T systolic.

## 2017-11-30 NOTE — ED Notes (Signed)
Pt vitals WDL. Pt suitable for transport with cardiac monitor. Pt alert, denies pain, no respiratory  distress noted.

## 2017-11-30 NOTE — ED Provider Notes (Signed)
[facility] EMERGENCY DEPARTMENT Provider Note   CSN: [id number] Arrival date & time: 11/30/17  1655     History   Chief Complaint Chief Complaint  Patient presents with  . Code Stroke    HPI [name] is a 84 y.o. female.  Patient is a 84 year old female with a history of a prior stroke, atrial fibrillation on aspirin and hypertension presenting today as a code stroke.  Patient's last seen normal was 1515.  She lives in independent living and was her normal self this morning when daughter talked to her on the phone.  Patient was found this evening after a fall in her apartment with a left sided gaze, aphasia and left-sided deficits.  The history is provided by the EMS personnel and a relative. The history is limited by the condition of the patient.    Past Medical History:  Diagnosis Date  . Acid reflux   . Cervical dysplasia   . Cystocele   . Heart valve disorder    "leaking"  . Hypertension   . Osteoporosis   . Rectocele   . Stroke [facility])    mild strokes  . Urinary incontinence     Patient Active Problem List   Diagnosis Date Noted  . Stroke (cerebrum) ([place]) 11/30/2017  . Hypokalemia 09/21/2015  . Hypomagnesemia 09/21/2015  . Acute lower GI bleeding 09/20/2015  . LLQ abdominal pain 09/20/2015  . Chronic hyponatremia 09/20/2015  . Diverticulosis of colon 09/20/2015  . Osteoporosis   . Cervical dysplasia   . Cystocele   . Rectocele   . GERD (gastroesophageal reflux disease)   . Urinary incontinence   . Moderate tricuspid regurgitation   . Hypertension   . History of CVA (cerebrovascular accident)     Past Surgical History:  Procedure Laterality Date  . APPENDECTOMY    . BLADDER SUSPENSION    . CHOLECYSTECTOMY  2003  . COLPOSCOPY    . INGUINAL HERNIA REPAIR    . PROLAPSED INTESTINES    . SURGERY FOR ADHESIONS RELATED TO INTESTINES    . TOTAL ABDOMINAL HYSTERECTOMY  1991   TAH, A/P REPAIR  . TOTAL HIP ARTHROPLASTY     X2      OB History    Gravida  6   Para  2   Term  2   Preterm      AB  4   Living  2     SAB      TAB      Ectopic      Multiple      Live Births               Home Medications    Prior to Admission medications   Medication Sig Start Date End Date Taking? Authorizing Provider  acetaminophen (TYLENOL) 500 MG tablet Take 1 tablet (500 mg total) by mouth 3 (three) times daily. Take 1 tablet (500mg ) 3 times daily x 5 days, then every 6 hours as needed. Patient taking differently: Take 500 mg by mouth 2 (two) times daily. Take 1 tablet (500mg ) 3 times daily x 5 days, then every 6 hours as needed. 09/23/15  Yes Eugenie Filler, MD  aspirin 81 MG tablet Take 1 tablet (81 mg total) by mouth See admin instructions. Every 3 days 09/29/15  Yes Eugenie Filler, MD  feeding supplement (BOOST / RESOURCE BREEZE) LIQD Take 1 Container by mouth 3 (three) times daily between meals. 09/22/15  Yes Eugenie Filler,  MD  losartan (COZAAR) 100 MG tablet Take 100 mg by mouth daily.   Yes [provider]  omeprazole (PRILOSEC) 20 MG capsule Take 1 capsule (20 mg total) by mouth daily. Patient taking differently: Take 20 mg by mouth 2 (two) times daily before a meal.  09/23/15  Yes Eugenie Filler, MD  Polyvinyl Alcohol-Povidone (REFRESH OP) Place 1 drop into both eyes daily. Take every day per patient   Yes [provider]  trimethoprim (TRIMPEX) 100 MG tablet Take 100 mg by mouth at bedtime. 09/03/17  Yes [provider]  pramoxine (PROCTOFOAM) 1 % foam Place rectally 3 (three) times daily as needed for itching. 09/22/15   Eugenie Filler, MD    Family History Family History  Problem Relation Age of Onset  . Cancer Mother        COLON  . Hypertension Father   . Heart disease Father   . Heart disease Sister   . Emphysema Brother     Social History Social History   Tobacco Use  . Smoking status: Never Smoker  . Smokeless tobacco: Never Used   Substance Use Topics  . Alcohol use: Yes    Alcohol/week: 4.0 standard drinks    Types: 4 Standard drinks or equivalent per week  . Drug use: No     Allergies   Penicillins; Quinine derivatives; and Aspirin   Review of Systems Review of Systems  Unable to perform ROS: Acuity of condition     Physical Exam Updated Vital Signs BP (!) 165/65   Pulse (!) 102   Resp (!) 25   Wt 41 kg   SpO2 97%   BMI 20.26 kg/m   Physical Exam  Constitutional: She appears well-developed and well-nourished. No distress.  HENT:  Head: Normocephalic and atraumatic.  Eyes: Pupils are equal, round, and reactive to light. EOM are normal.  Cardiovascular: An irregularly irregular rhythm present. Tachycardia present.  Pulmonary/Chest: Effort normal. No respiratory distress. She has no wheezes. She has no rales. She exhibits no tenderness.  Abdominal: Soft. She exhibits no distension. There is no tenderness. There is no guarding.  Neurological: She is alert. A cranial nerve deficit and sensory deficit is present.  Left-sided facial droop, left-sided gaze preference, left upper and lower extremity weakness.  Aphasia noted.  Nursing note and vitals reviewed.    ED Treatments / Results  Labs (all labs ordered are listed, but only abnormal results are displayed) Labs Reviewed  CBG MONITORING, ED - Abnormal; Notable for the following components:      Result Value   Glucose-Capillary 104 (*)    All other components within normal limits  I-STAT TROPONIN, ED - Abnormal; Notable for the following components:   Troponin i, poc 0.11 (*)    All other components within normal limits  I-STAT CHEM 8, ED - Abnormal; Notable for the following components:   Sodium 126 (*)    Chloride 91 (*)    BUN 26 (*)    Glucose, Bld 105 (*)    Calcium, Ion 1.03 (*)    All other components within normal limits  HEMOGLOBIN A1C  LIPID PANEL  URINALYSIS, COMPLETE (UACMP) WITH MICROSCOPIC  CBC  BASIC METABOLIC PANEL   I-STAT TROPONIN, ED  CBG MONITORING, ED  I-STAT CHEM 8, ED    EKG None  Radiology Ct Head Code Stroke Wo Contrast  Result Date: 11/30/2017 CLINICAL DATA:  Code stroke. Initial evaluation for acute aphasia, left-sided gaze. EXAM: CT HEAD WITHOUT CONTRAST  TECHNIQUE: Contiguous axial images were obtained from the base of the skull through the vertex without intravenous contrast. COMPARISON:  Prior CT from 05/14/2015. FINDINGS: Brain: Moderate cerebral atrophy. Severe chronic microvascular ischemic disease seen throughout the periventricular and deep white matter both cerebral hemispheres. Scattered remote lacunar infarcts within the bilateral basal ganglia. Remote left cerebellar infarcts noted as well. Subtle evolving hypodensity seen involving cortical gray matter at the supra ganglionic posterior left frontal lobe, M5 distribution, inferior left precentral gyrus (images 23, 22). No other definite evidence for acute evolving ischemic infarct. No acute intracranial hemorrhage. No mass lesion, midline shift or mass effect. No hydrocephalus. No extra-axial fluid collection. Vascular: No hyperdense vessel. Calcified atherosclerosis at the skull base. Skull: Scalp soft tissues and calvarium within normal limits. Sinuses/Orbits: Left-sided gaze noted. Paranasal sinuses are clear. No mastoid effusion. Other: None. ASPECTS [facility] Stroke Program Early CT Score) - Ganglionic level infarction (caudate, lentiform nuclei, internal capsule, insula, M1-M3 cortex): 7 - Supraganglionic infarction (M4-M6 cortex): 2 Total score (0-10 with 10 being normal): 9 IMPRESSION: 1. Subtle evolving hypodensity involving the supra ganglionic posterior left frontal lobe (M5 distribution), suspicious for evolving acute ischemic left MCA territory infarct. No associated hemorrhage. 2. ASPECTS is 9. 3. Atrophy with severe chronic microvascular ischemic disease with remote left cerebellar infarcts. Critical Value/emergent results were  called by telephone at the time of interpretation on 11/30/2017 at 5:25 pm to Dr. Erlinda Hong , who verbally acknowledged these results. Electronically Signed   By: Jeannine Boga M.D.   On: 11/30/2017 17:32    Procedures Procedures (including critical care time)  Medications Ordered in ED Medications  alteplase (ACTIVASE) 1 mg/mL infusion 36.9 mg (has no administration in time range)    Followed by  0.9 %  sodium chloride infusion (has no administration in time range)  clevidipine (CLEVIPREX) infusion 0.5 mg/mL (1 mg/hr Intravenous Rate/Dose Change 11/30/17 1820)  clevidipine (CLEVIPREX) 0.5 MG/ML infusion (has no administration in time range)   stroke: mapping our early stages of recovery book (has no administration in time range)  0.9 %  sodium chloride infusion (has no administration in time range)  acetaminophen (TYLENOL) tablet 650 mg (has no administration in time range)    Or  acetaminophen (TYLENOL) solution 650 mg (has no administration in time range)    Or  acetaminophen (TYLENOL) suppository 650 mg (has no administration in time range)  famotidine (PEPCID) IVPB 20 mg premix (has no administration in time range)  iopamidol (ISOVUE-370) 76 % injection 50 mL (50 mLs Intravenous Contrast Given 11/30/17 1713)  labetalol (NORMODYNE,TRANDATE) injection 20 mg (20 mg Intravenous Given 11/30/17 1712)  labetalol (NORMODYNE,TRANDATE) injection 20 mg (20 mg Intravenous Given 11/30/17 1740)     Initial Impression / Assessment and Plan / ED Course  I have reviewed the triage vital signs and the nursing notes.  Pertinent labs & imaging results that were available during my care of the patient were reviewed by me and considered in my medical decision making (see chart for details).     Patient presenting today as a code stroke last seen normal at 1515.  Patient has dense deficits with left-sided gaze and left-sided paralysis.  Prior to this patient was highly functional lived in independent living.   Patient is hypertensive, tachycardic and evidence of atrial fibrillation on EKG.  Patient has known atrial fibrillation and only takes aspirin because she was not thought to be a good anticoagulation candidate due to intermittent falls.   Neurology is at bedside and  patient was taken immediately to the CT scanner as her airway is intact.  CAT scan showed subtle evolving hypodensity involving the supra ganglionic posterior left frontal lobe suspicion for acute ischemic left MCA territory infarct without hemorrhage.  Patient was started on clevidipine for blood pressure management and TPA.  Patient will be admitted to the neuro ICU.  Patient's blood pressure was improving and clevidipine was discontinued.  We will continue monitoring closely.  Patient still with a left-sided gaze deficit at this time and severe aphasia.  CRITICAL CARE Performed by: Brandolyn Shortridge Total critical care time: 30 minutes Critical care time was exclusive of separately billable procedures and treating other patients. Critical care was necessary to treat or prevent imminent or life-threatening deterioration. Critical care was time spent personally by me on the following activities: development of treatment plan with patient and/or surrogate as well as nursing, discussions with consultants, evaluation of patient's response to treatment, examination of patient, obtaining history from patient or surrogate, ordering and performing treatments and interventions, ordering and review of laboratory studies, ordering and review of radiographic studies, pulse oximetry and re-evaluation of patient's condition.  Final Clinical Impressions(s) / ED Diagnoses   Final diagnoses:  Acute ischemic stroke [facility])    ED Discharge Orders    None       Blanchie Dessert, MD 11/30/17 1844

## 2017-11-30 NOTE — ED Notes (Signed)
Called [facility] lab to inquire about labs not resulted for patient, lab stated orders cancelled by interface user. Provider asked to reorder labs.

## 2017-11-30 NOTE — H&P (Signed)
Stroke Neurology admission Note  Consult Requested by: Dr. Maryan Rued  Reason for Consult: code stroke  Consult Date: 11/30/17  The history was obtained from the EMS.  During history and examination, all items were not able to obtain.  History of Present Illness:  [name] is a 84 y.o. Caucasian female with PMH of CVA 2 years ago, HTN, PAF on ASA presented for code stroke.  As per EMS, pt has been doing well at independent living until 1515 was found to have wobbly walking with a fall, did not hit head, she was subsequently found to have left forced gaze and aphasia. On EMS arrival, she still has gaze and aphsia but no focal weakness. CBG 110 and BP 150s. Code stroke called.   Pt had fall in 08/2017 admitted and found to have PAF. She followed with cardiology as outpt and decided not candidate for [facility] given advance age and falls. Pt also had mild LGIB in 08/2015 due to hemorrhoids. She is on ASA at nursing facility. Baseline AAOx3 and talking walking independently.   LSN: [id number] today tPA Given: Yes  Past Medical History:  Diagnosis Date  . Acid reflux   . Cervical dysplasia   . Cystocele   . Heart valve disorder    "leaking"  . Hypertension   . Osteoporosis   . Rectocele   . Stroke [facility])    mild strokes  . Urinary incontinence     Past Surgical History:  Procedure Laterality Date  . APPENDECTOMY    . BLADDER SUSPENSION    . CHOLECYSTECTOMY  2003  . COLPOSCOPY    . INGUINAL HERNIA REPAIR    . PROLAPSED INTESTINES    . SURGERY FOR ADHESIONS RELATED TO INTESTINES    . TOTAL ABDOMINAL HYSTERECTOMY  1991   TAH, A/P REPAIR  . TOTAL HIP ARTHROPLASTY     X2    Family History  Problem Relation Age of Onset  . Cancer Mother        COLON  . Hypertension Father   . Heart disease Father   . Heart disease Sister   . Emphysema Brother     Social History:  reports that she has never smoked. She has never used smokeless tobacco. She reports that she drinks about 4.0 standard  drinks of alcohol per week. She reports that she does not use drugs.  Allergies:  Allergies  Allergen Reactions  . Penicillins Anaphylaxis    Has patient had a PCN reaction causing immediate rash, facial/tongue/throat swelling, SOB or lightheadedness with hypotension: Yes Has patient had a PCN reaction causing severe rash involving mucus membranes or skin necrosis: Unknown Has patient had a PCN reaction that required hospitalization: No Has patient had a PCN reaction occurring within the last 10 years: No If all of the above answers are "NO", then may proceed with Cephalosporin use.   . Quinine Derivatives Other (See Comments)    unknown  . Aspirin Rash    "full strength"    No current facility-administered medications on file prior to encounter.    Current Outpatient Medications on File Prior to Encounter  Medication Sig Dispense Refill  . acetaminophen (TYLENOL) 500 MG tablet Take 1 tablet (500 mg total) by mouth 3 (three) times daily. Take 1 tablet (500mg ) 3 times daily x 5 days, then every 6 hours as needed. (Patient taking differently: Take 500 mg by mouth 2 (two) times daily. Take 1 tablet (500mg ) 3 times daily x 5 days, then every 6  hours as needed.) 30 tablet 0  . aspirin 81 MG tablet Take 1 tablet (81 mg total) by mouth See admin instructions. Every 3 days 30 tablet   . feeding supplement (BOOST / RESOURCE BREEZE) LIQD Take 1 Container by mouth 3 (three) times daily between meals.  0  . losartan (COZAAR) 100 MG tablet Take 100 mg by mouth daily.    Marland Kitchen omeprazole (PRILOSEC) 20 MG capsule Take 1 capsule (20 mg total) by mouth daily. (Patient taking differently: Take 20 mg by mouth 2 (two) times daily before a meal. ) 30 capsule 0  . Polyvinyl Alcohol-Povidone (REFRESH OP) Place 1 drop into both eyes daily. Take every day per patient    . pramoxine (PROCTOFOAM) 1 % foam Place rectally 3 (three) times daily as needed for itching. 15 g 0  . trimethoprim (TRIMPEX) 100 MG tablet Take  100 mg by mouth at bedtime.  12    Review of Systems: A full ROS was attempted today and was not  able to be performed due to aphasia.  Physical Examination: Pulse Rate:  [65-130] 102 (09/09 1815) Resp:  [21-30] 25 (09/09 1815) BP: (135-175)/(65-134) 165/65 (09/09 1815) SpO2:  [95 %-98 %] 97 % (09/09 1815) Weight:  [41 kg] 41 kg (09/09 1700)  General - well nourished, well developed, in no apparent distress.    Ophthalmologic - fundi not visualized due to noncooperation.    Cardiovascular - irregularly irregular heart rate and rhythm.  Neuro - awake, alert, but severe aphasia, able to get 2-3 words out intermittently but not full sentence. Not following commands but able to mimic. Left forced gaze, right neglect and not blinking to visual threat on the right. No significant facial droop, tongue midline. PERRL. Moving all extremities equally, no drift. DTR 1+ throughout, no babinski. Sensation, coordination and gait not tested.  NIH Stroke Scale  Level Of Consciousness 0=Alert; keenly responsive 1=Not alert, but arousable by minor stimulation 2=Not alert, requires repeated stimulation 3=Responds only with reflex movements 0  LOC Questions to Month and Age 84=Answers both questions correctly 1=Answers one question correctly 2=Answers neither question correctly 2  LOC Commands      -Open/Close eyes     -Open/close grip 0=Performs both tasks correctly 1=Performs one task correctly 2=Performs neighter task correctly 2  Best Gaze 0=Normal 1=Partial gaze palsy 2=Forced deviation, or total gaze paresis 2  Visual 0=No visual loss 1=Partial hemianopia 2=Complete hemianopia 3=Bilateral hemianopia (blind including cortical blindness) 2  Facial Palsy 0=Normal symmetrical movement 1=Minor paralysis (asymmetry) 2=Partial paralysis (lower face) 3=Complete paralysis (upper and lower face) 0  Motor  0=No drift, limb holds posture for full 10 seconds 1=Drift, limb holds posture, no drift  to bed 2=Some antigravity effort, cannot maintain posture, drifts to bed 3=No effort against gravity, limb falls 4=No movement Right Arm 0     Leg 0    Left Arm 0     Leg 0  Limb Ataxia 0=Absent 1=Present in one limb 2=Present in two limbs 0  Sensory 0=Normal 1=Mild to moderate sensory loss 2=Severe to total sensory loss 0  Best Language 0=No aphasia, normal 1=Mild to moderate aphasia 2=Mute, global aphasia 3=Mute, global aphasia 2  Dysarthria 0=Normal 1=Mild to moderate 2=Severe, unintelligible or mute/anarthric 2  Extinction/Neglect 0=No abnormality 1=Extinction to bilateral simultaneous stimulation 2=Profound neglect 2  Total   14     Data Reviewed: Ct Head Code Stroke Wo Contrast  Result Date: 11/30/2017 CLINICAL DATA:  Code stroke. Initial evaluation for acute  aphasia, left-sided gaze. EXAM: CT HEAD WITHOUT CONTRAST TECHNIQUE: Contiguous axial images were obtained from the base of the skull through the vertex without intravenous contrast. COMPARISON:  Prior CT from 05/14/2015. FINDINGS: Brain: Moderate cerebral atrophy. Severe chronic microvascular ischemic disease seen throughout the periventricular and deep white matter both cerebral hemispheres. Scattered remote lacunar infarcts within the bilateral basal ganglia. Remote left cerebellar infarcts noted as well. Subtle evolving hypodensity seen involving cortical gray matter at the supra ganglionic posterior left frontal lobe, M5 distribution, inferior left precentral gyrus (images 23, 22). No other definite evidence for acute evolving ischemic infarct. No acute intracranial hemorrhage. No mass lesion, midline shift or mass effect. No hydrocephalus. No extra-axial fluid collection. Vascular: No hyperdense vessel. Calcified atherosclerosis at the skull base. Skull: Scalp soft tissues and calvarium within normal limits. Sinuses/Orbits: Left-sided gaze noted. Paranasal sinuses are clear. No mastoid effusion. Other: None. ASPECTS  [facility] Stroke Program Early CT Score) - Ganglionic level infarction (caudate, lentiform nuclei, internal capsule, insula, M1-M3 cortex): 7 - Supraganglionic infarction (M4-M6 cortex): 2 Total score (0-10 with 10 being normal): 9 IMPRESSION: 1. Subtle evolving hypodensity involving the supra ganglionic posterior left frontal lobe (M5 distribution), suspicious for evolving acute ischemic left MCA territory infarct. No associated hemorrhage. 2. ASPECTS is 9. 3. Atrophy with severe chronic microvascular ischemic disease with remote left cerebellar infarcts. Critical Value/emergent results were called by telephone at the time of interpretation on 11/30/2017 at 5:25 pm to Dr. Erlinda Hong , who verbally acknowledged these results. Electronically Signed   By: Jeannine Boga M.D.   On: 11/30/2017 17:32    Assessment: 84 y.o. female with PMH of CVA 2 years ago, HTN, PAF on ASA presented for forced left gaze and aphsia but no focal weakness. CT no bleeding and no contraindication for tPA. tPA given. Pt had PAF but not candidate for [facility] given advance age and falls as per cardiology. Pt also had mild LGIB in 08/2015 due to hemorrhoids. She is on ASA at nursing facility. Baseline AAOx3 and talking walking independently. BP high before and after tPA, treated with labetalol and now cleviprex drip. CTA head and neck no LVO, not candidate for IR. Will admit to ICU.  Stroke Risk Factors - atrial fibrillation and hypertension  Plan: - admit to ICU - BP goal < 180/105 post tPA - HgbA1c, fasting lipid panel - MRI of the brain without contrast at 24h of tPA - PT consult, OT consult, Speech consult - Echocardiogram - Telemetry monitoring - Frequent neuro checks  This patient is critically ill due to code stroke, tPA administration, afib and at significant risk of neurological worsening, death form recurrent stroke, hemorrhagic conversion, heart failure, seizure. This patient's care requires constant monitoring of vital signs,  hemodynamics, respiratory and cardiac monitoring, review of multiple databases, neurological assessment, discussion with family, other specialists and medical decision making of high complexity. I spent 50 minutes of neurocritical care time in the care of this patient. I had long discussion with daughter later at bedside, updated pt current condition, treatment plan and potential prognosis. They expressed understanding and appreciation.   Rosalin Hawking, MD PhD Stroke Neurology 11/30/2017 6:31 PM

## 2017-11-30 NOTE — ED Notes (Signed)
BP 146/77. This RN titrated cleviprex down to 1mg /hour.

## 2017-11-30 NOTE — Progress Notes (Signed)
Pharmacist Code Stroke Response  Notified to mix tPA at 1708 by Dr. Erlinda Hong Delivered tPA to RN at 1713  Issues/delays encountered (if applicable): Needed to get an IV pump and RN was putting in a second line  [name], [name] 11/30/17 5:20 PM

## 2017-12-01 ENCOUNTER — Inpatient Hospital Stay (HOSPITAL_COMMUNITY): Payer: Medicare Other

## 2017-12-01 ENCOUNTER — Encounter (HOSPITAL_COMMUNITY): Payer: Self-pay | Admitting: *Deleted

## 2017-12-01 DIAGNOSIS — I351 Nonrheumatic aortic (valve) insufficiency: Secondary | ICD-10-CM

## 2017-12-01 DIAGNOSIS — I63512 Cerebral infarction due to unspecified occlusion or stenosis of left middle cerebral artery: Principal | ICD-10-CM

## 2017-12-01 DIAGNOSIS — I639 Cerebral infarction, unspecified: Secondary | ICD-10-CM

## 2017-12-01 DIAGNOSIS — E785 Hyperlipidemia, unspecified: Secondary | ICD-10-CM

## 2017-12-01 DIAGNOSIS — I4891 Unspecified atrial fibrillation: Secondary | ICD-10-CM

## 2017-12-01 LAB — BASIC METABOLIC PANEL
Anion gap: 13 (ref 5–15)
BUN: 17 mg/dL (ref 8–23)
CO2: 25 mmol/L (ref 22–32)
Calcium: 8.3 mg/dL — ABNORMAL LOW (ref 8.9–10.3)
Chloride: 93 mmol/L — ABNORMAL LOW (ref 98–111)
Creatinine, Ser: 0.83 mg/dL (ref 0.44–1.00)
GFR calc Af Amer: >60 mL/min (ref >60)
GFR, EST NON AFRICAN AMERICAN: 58 mL/min — AB (ref >60)
GLUCOSE: 99 mg/dL (ref 70–99)
Potassium: 3.3 mmol/L — ABNORMAL LOW (ref 3.5–5.1)
Sodium: 131 mmol/L — ABNORMAL LOW (ref 135–145)

## 2017-12-01 LAB — CBC
HEMATOCRIT: 38.3 % (ref 36.0–46.0)
Hemoglobin: 13.1 g/dL (ref 12.0–15.0)
MCH: 31.6 pg (ref 26.0–34.0)
MCHC: 34.2 g/dL (ref 30.0–36.0)
MCV: 92.3 fL (ref 78.0–100.0)
Platelets: 295 10*3/uL (ref 150–400)
RBC: 4.15 MIL/uL (ref 3.87–5.11)
RDW: 13.5 % (ref 11.5–15.5)
WBC: 11.5 10*3/uL — ABNORMAL HIGH (ref 4.0–10.5)

## 2017-12-01 LAB — MRSA PCR SCREENING: MRSA by PCR: NEGATIVE

## 2017-12-01 LAB — LIPID PANEL
Cholesterol: 178 mg/dL (ref 0–200)
HDL: 64 mg/dL (ref >40)
LDL Cholesterol: 95 mg/dL (ref 0–99)
Total CHOL/HDL Ratio: 2.8 RATIO
Triglycerides: 94 mg/dL (ref <150)
VLDL: 19 mg/dL (ref 0–40)

## 2017-12-01 LAB — ECHOCARDIOGRAM COMPLETE: Weight: 1449.74 oz

## 2017-12-01 LAB — HEMOGLOBIN A1C
Hgb A1c MFr Bld: 5.6 % (ref 4.8–5.6)
Mean Plasma Glucose: 114.02 mg/dL

## 2017-12-01 MED ORDER — LORAZEPAM 2 MG/ML IJ SOLN: 1.0000 mg | Freq: Once | INTRAMUSCULAR | Status: DC

## 2017-12-01 MED ORDER — CLOPIDOGREL BISULFATE 75 MG PO TABS
75.0000 mg | ORAL_TABLET | Freq: Every day | ORAL | Status: DC
  Administered 2017-12-02: 75 mg via ORAL
  Filled 2017-12-01 (×3): qty 1

## 2017-12-01 MED ORDER — METOPROLOL TARTRATE 25 MG PO TABS
25.0000 mg | ORAL_TABLET | Freq: Two times a day (BID) | ORAL | Status: DC
  Administered 2017-12-01 – 2017-12-02 (×3): 25 mg via ORAL
  Filled 2017-12-01 (×3): qty 1

## 2017-12-01 MED ORDER — ENOXAPARIN SODIUM 40 MG/0.4ML ~~LOC~~ SOLN: 40.0000 mg | SUBCUTANEOUS | Status: DC

## 2017-12-01 MED ORDER — PANTOPRAZOLE SODIUM 40 MG PO TBEC
40.0000 mg | DELAYED_RELEASE_TABLET | Freq: Every day | ORAL | Status: DC
  Administered 2017-12-01 – 2017-12-03 (×3): 40 mg via ORAL
  Filled 2017-12-01 (×3): qty 1

## 2017-12-01 MED ORDER — PRAVASTATIN SODIUM 20 MG PO TABS
20.0000 mg | ORAL_TABLET | Freq: Every day | ORAL | Status: DC
  Administered 2017-12-01 – 2017-12-02 (×2): 20 mg via ORAL
  Filled 2017-12-01 (×2): qty 1

## 2017-12-01 MED ORDER — AMLODIPINE BESYLATE 2.5 MG PO TABS
2.5000 mg | ORAL_TABLET | Freq: Every day | ORAL | Status: DC
  Administered 2017-12-01 – 2017-12-02 (×2): 2.5 mg via ORAL
  Filled 2017-12-01 (×2): qty 1

## 2017-12-01 MED ORDER — ENOXAPARIN SODIUM 30 MG/0.3ML ~~LOC~~ SOLN
30.0000 mg | SUBCUTANEOUS | Status: DC
  Administered 2017-12-01 – 2017-12-02 (×2): 30 mg via SUBCUTANEOUS
  Filled 2017-12-01 (×2): qty 0.3

## 2017-12-01 NOTE — Progress Notes (Signed)
  Echocardiogram 2D Echocardiogram has been performed.  [name] 12/01/2017, 1:07 PM

## 2017-12-01 NOTE — Progress Notes (Signed)
STROKE TEAM PROGRESS NOTE   SUBJECTIVE (INTERVAL HISTORY) Her daughter is at the bedside.  Overall she feels her condition is stable. Her forced left gaze is improved but still has left gaze preference and partial global aphasia. afib RVR on tele. Passed swallow.    OBJECTIVE Temp:  [97.4 F (36.3 C)-99.1 F (37.3 C)] 97.7 F (36.5 C) (09/10 1200) Pulse Rate:  [36-135] 63 (09/10 1130) Cardiac Rhythm: Normal sinus rhythm (09/10 0800) Resp:  [14-39] 23 (09/10 1130) BP: (120-189)/(62-165) 138/106 (09/10 1130) SpO2:  [93 %-100 %] 97 % (09/10 1130) Weight:  [41 kg-41.1 kg] 41.1 kg (09/10 0100)  Recent Labs  Lab 11/30/17 1658  GLUCAP 104*   Recent Labs  Lab 11/30/17 1706 11/30/17 2031 12/01/17 0321  NA 126* 128* 131*  K 3.5 3.7 3.3*  CL 91* 90* 93*  CO2  --  25 25  GLUCOSE 105* 97 99  BUN 26* 24* 17  CREATININE 0.90 1.02* 0.83  CALCIUM  --  8.7* 8.3*   Recent Labs  Lab 11/30/17 2031  AST 30  ALT 14  ALKPHOS 76  BILITOT 1.2  PROT 6.5  ALBUMIN 3.4*   Recent Labs  Lab 11/30/17 1706 11/30/17 2031 12/01/17 0321  WBC  --  11.3* 11.5*  NEUTROABS  --  8.1*  --   HGB 13.9 13.3 13.1  HCT 41.0 39.2 38.3  MCV  --  94.5 92.3  PLT  --  328 295   No results for input(s): CKTOTAL, CKMB, CKMBINDEX, TROPONINI in the last 168 hours. Recent Labs    11/30/17 2031  LABPROT 13.8  INR 1.07   Recent Labs    11/30/17 1937  COLORURINE YELLOW  LABSPEC 1.036*  PHURINE 5.0  GLUCOSEU NEGATIVE  HGBUR SMALL*  BILIRUBINUR NEGATIVE  KETONESUR NEGATIVE  PROTEINUR 30*  NITRITE NEGATIVE  LEUKOCYTESUR MODERATE*       Component Value Date/Time   CHOL 178 12/01/2017 0321   TRIG 94 12/01/2017 0321   HDL 64 12/01/2017 0321   CHOLHDL 2.8 12/01/2017 0321   VLDL 19 12/01/2017 0321   LDLCALC 95 12/01/2017 0321   Lab Results  Component Value Date   HGBA1C 5.6 12/01/2017      Component Value Date/Time   LABOPIA NONE DETECTED 11/30/2017 1937   COCAINSCRNUR NONE DETECTED  11/30/2017 1937   LABBENZ NONE DETECTED 11/30/2017 1937   AMPHETMU NONE DETECTED 11/30/2017 1937   THCU NONE DETECTED 11/30/2017 1937   LABBARB NONE DETECTED 11/30/2017 1937    Recent Labs  Lab 11/30/17 2031  ETH <10    I have personally reviewed the radiological images below and agree with the radiology interpretations.  Ct Angio Head W Or Wo Contrast  Result Date: 11/30/2017 CLINICAL DATA:  Initial evaluation for acute stroke, left-sided gaze, aphasia. EXAM: CT ANGIOGRAPHY HEAD AND NECK TECHNIQUE: Multidetector CT imaging of the head and neck was performed using the standard protocol during bolus administration of intravenous contrast. Multiplanar CT image reconstructions and MIPs were obtained to evaluate the vascular anatomy. Carotid stenosis measurements (when applicable) are obtained utilizing NASCET criteria, using the distal internal carotid diameter as the denominator. CONTRAST:  53mL ISOVUE-370 IOPAMIDOL (ISOVUE-370) INJECTION 76% COMPARISON:  Prior noncontrast head CT from earlier the same day. FINDINGS: CTA NECK FINDINGS Aortic arch: Visualized aortic arch of normal caliber with normal branch pattern. Extensive atheromatous plaque throughout the visualized arch and about the origin of the great vessels without hemodynamically significant stenosis. Partially visualized subclavian arteries patent bilaterally. Right carotid  system: Right common carotid artery tortuous proximally but patent to the bifurcation without stenosis. Scattered calcified plaque about the right bifurcation with associated stenosis of up to 40% by NASCET criteria. Right ICA widely patent distally to the skull base without stenosis, dissection, or occlusion. Left carotid system: Left common carotid artery tortuous proximally but widely patent to the bifurcation without stenosis. Calcified eccentric plaque about the left bifurcation without hemodynamically significant stenosis. Left ICA patent distally to the skull base  without stenosis, dissection, or occlusion. Vertebral arteries: Vertebral arteries appear to arise from the subclavian arteries bilaterally. Right vertebral artery dominant, with a diffusely hypoplastic left vertebral artery. Left vertebral artery essentially occluded at its origin, with minimal scant flow within the neck. There is some distal reconstitution at the distal left V2/V3 segment, likely via muscular branches. Atheromatous plaque at the origin of the right vertebral artery with associated moderate approximate 50% stenosis. Right vert towards a sponge otherwise patent distally within the neck. Skeleton: No acute osseous abnormality. No worrisome lytic or blastic osseous lesions. C3-4 and C5-6 vertebral bodies are ankylosed. Other neck: No acute soft tissue abnormality within the neck. Small sialolith noted within the left submandibular gland. No adenopathy. Upper chest: Visualized upper chest demonstrates no acute abnormality. Partially visualized lungs are clear. Review of the MIP images confirms the above findings CTA HEAD FINDINGS Anterior circulation: Petrous segments patent bilaterally. Smooth atheromatous plaque within the cavernous/supraclinoid ICAs without significant stenosis. ICA termini widely patent. Patent right A1 segment. Hypoplastic and/or absent left A1, accounting for the diminutive left ICA is compared to the right. Patent anterior communicating artery. Anterior cerebral arteries patent to their distal aspects without stenosis. Left M1 patent without stenosis. No proximal left M2 occlusion. Possible subtle distal small vessel occlusion at the superior aspect of the left sylvian fissure (series 7, image 79) Focal stenosis at the proximal right M1 segment with moderate diffuse narrowing distally. Normal right MCA bifurcation. No proximal right M2 occlusion. Distal right MCA branches well perfused. Distal small vessel atheromatous irregularity. Posterior circulation: Right vertebral artery  dominant. Atheromatous plaque within the right V4 segment with associated moderate stenosis. Right for PICA patent. Attenuated flow within the distal left vertebral artery which is grossly patent to the takeoff of the left PICA. Left PICA grossly opacified proximally. Left vertebral artery essentially occluded beyond the left PICA. Basilar artery diminutive and mildly irregular but patent to its distal aspect without stenosis. Superior cerebral arteries patent proximally. Predominant fetal type origin of the PCAs supplied via a prominent posterior communicating arteries. PCAs patent to their distal aspects. Venous sinuses: Patent. Anatomic variants: Predominant fetal type origin of the PCAs. Delayed phase: Not performed. Review of the MIP images confirms the above findings IMPRESSION: 1. Negative CTA for large vessel occlusion. Possible distal branch/small-vessel left MCA occlusion at the superior aspect of the left sylvian fissure as above. 2. Atheromatous plaque about the carotid bifurcations bilaterally, associated 40% stenosis on the right. No significant narrowing at the left bifurcation. 3. Approximate 50% atheromatous stenosis at the origin of the dominant right vertebral artery. Left vertebral artery largely occluded within the neck, with some distal reconstitution prior to the skull base. Vertebrobasilar system overall diminutive with fetal type origin of the PCAs bilaterally. 4. Additional mild-to-moderate age-related atheromatous disease as above. Results were discussed with Dr. Erlinda Hong, at 5:45 p.m. on 11/30/2017. Electronically Signed   By: Jeannine Boga M.D.   On: 11/30/2017 19:00   Ct Angio Neck W Or Wo Contrast  Result  Date: 11/30/2017 CLINICAL DATA:  Initial evaluation for acute stroke, left-sided gaze, aphasia. EXAM: CT ANGIOGRAPHY HEAD AND NECK TECHNIQUE: Multidetector CT imaging of the head and neck was performed using the standard protocol during bolus administration of intravenous  contrast. Multiplanar CT image reconstructions and MIPs were obtained to evaluate the vascular anatomy. Carotid stenosis measurements (when applicable) are obtained utilizing NASCET criteria, using the distal internal carotid diameter as the denominator. CONTRAST:  78mL ISOVUE-370 IOPAMIDOL (ISOVUE-370) INJECTION 76% COMPARISON:  Prior noncontrast head CT from earlier the same day. FINDINGS: CTA NECK FINDINGS Aortic arch: Visualized aortic arch of normal caliber with normal branch pattern. Extensive atheromatous plaque throughout the visualized arch and about the origin of the great vessels without hemodynamically significant stenosis. Partially visualized subclavian arteries patent bilaterally. Right carotid system: Right common carotid artery tortuous proximally but patent to the bifurcation without stenosis. Scattered calcified plaque about the right bifurcation with associated stenosis of up to 40% by NASCET criteria. Right ICA widely patent distally to the skull base without stenosis, dissection, or occlusion. Left carotid system: Left common carotid artery tortuous proximally but widely patent to the bifurcation without stenosis. Calcified eccentric plaque about the left bifurcation without hemodynamically significant stenosis. Left ICA patent distally to the skull base without stenosis, dissection, or occlusion. Vertebral arteries: Vertebral arteries appear to arise from the subclavian arteries bilaterally. Right vertebral artery dominant, with a diffusely hypoplastic left vertebral artery. Left vertebral artery essentially occluded at its origin, with minimal scant flow within the neck. There is some distal reconstitution at the distal left V2/V3 segment, likely via muscular branches. Atheromatous plaque at the origin of the right vertebral artery with associated moderate approximate 50% stenosis. Right vert towards a sponge otherwise patent distally within the neck. Skeleton: No acute osseous abnormality.  No worrisome lytic or blastic osseous lesions. C3-4 and C5-6 vertebral bodies are ankylosed. Other neck: No acute soft tissue abnormality within the neck. Small sialolith noted within the left submandibular gland. No adenopathy. Upper chest: Visualized upper chest demonstrates no acute abnormality. Partially visualized lungs are clear. Review of the MIP images confirms the above findings CTA HEAD FINDINGS Anterior circulation: Petrous segments patent bilaterally. Smooth atheromatous plaque within the cavernous/supraclinoid ICAs without significant stenosis. ICA termini widely patent. Patent right A1 segment. Hypoplastic and/or absent left A1, accounting for the diminutive left ICA is compared to the right. Patent anterior communicating artery. Anterior cerebral arteries patent to their distal aspects without stenosis. Left M1 patent without stenosis. No proximal left M2 occlusion. Possible subtle distal small vessel occlusion at the superior aspect of the left sylvian fissure (series 7, image 79) Focal stenosis at the proximal right M1 segment with moderate diffuse narrowing distally. Normal right MCA bifurcation. No proximal right M2 occlusion. Distal right MCA branches well perfused. Distal small vessel atheromatous irregularity. Posterior circulation: Right vertebral artery dominant. Atheromatous plaque within the right V4 segment with associated moderate stenosis. Right for PICA patent. Attenuated flow within the distal left vertebral artery which is grossly patent to the takeoff of the left PICA. Left PICA grossly opacified proximally. Left vertebral artery essentially occluded beyond the left PICA. Basilar artery diminutive and mildly irregular but patent to its distal aspect without stenosis. Superior cerebral arteries patent proximally. Predominant fetal type origin of the PCAs supplied via a prominent posterior communicating arteries. PCAs patent to their distal aspects. Venous sinuses: Patent. Anatomic  variants: Predominant fetal type origin of the PCAs. Delayed phase: Not performed. Review of the MIP images confirms the  above findings IMPRESSION: 1. Negative CTA for large vessel occlusion. Possible distal branch/small-vessel left MCA occlusion at the superior aspect of the left sylvian fissure as above. 2. Atheromatous plaque about the carotid bifurcations bilaterally, associated 40% stenosis on the right. No significant narrowing at the left bifurcation. 3. Approximate 50% atheromatous stenosis at the origin of the dominant right vertebral artery. Left vertebral artery largely occluded within the neck, with some distal reconstitution prior to the skull base. Vertebrobasilar system overall diminutive with fetal type origin of the PCAs bilaterally. 4. Additional mild-to-moderate age-related atheromatous disease as above. Results were discussed with Dr. Erlinda Hong, at 5:45 p.m. on 11/30/2017. Electronically Signed   By: Jeannine Boga M.D.   On: 11/30/2017 19:00   Dg Chest Port 1 View  Result Date: 12/01/2017 CLINICAL DATA:  84 year old female.  CHF.  Initial encounter. EXAM: PORTABLE CHEST 1 VIEW COMPARISON:  05/14/2015 chest x-ray. FINDINGS: Cardiomegaly. Calcified tortuous aorta. No infiltrate, congestive heart failure or pneumothorax. Prominent right shoulder joint degenerative changes. IMPRESSION: 1. Cardiomegaly. 2.  Aortic Atherosclerosis (ICD10-I70.0).  Calcified tortuous aorta 3. No infiltrate or congestive heart failure. 4. Evaluation of retrocardiac region slightly limited although appearing similar to prior exam. Electronically Signed   By: Genia Del M.D.   On: 12/01/2017 12:20   Ct Head Code Stroke Wo Contrast  Result Date: 11/30/2017 CLINICAL DATA:  Code stroke. Initial evaluation for acute aphasia, left-sided gaze. EXAM: CT HEAD WITHOUT CONTRAST TECHNIQUE: Contiguous axial images were obtained from the base of the skull through the vertex without intravenous contrast. COMPARISON:  Prior CT  from 05/14/2015. FINDINGS: Brain: Moderate cerebral atrophy. Severe chronic microvascular ischemic disease seen throughout the periventricular and deep white matter both cerebral hemispheres. Scattered remote lacunar infarcts within the bilateral basal ganglia. Remote left cerebellar infarcts noted as well. Subtle evolving hypodensity seen involving cortical gray matter at the supra ganglionic posterior left frontal lobe, M5 distribution, inferior left precentral gyrus (images 23, 22). No other definite evidence for acute evolving ischemic infarct. No acute intracranial hemorrhage. No mass lesion, midline shift or mass effect. No hydrocephalus. No extra-axial fluid collection. Vascular: No hyperdense vessel. Calcified atherosclerosis at the skull base. Skull: Scalp soft tissues and calvarium within normal limits. Sinuses/Orbits: Left-sided gaze noted. Paranasal sinuses are clear. No mastoid effusion. Other: None. ASPECTS [facility] Stroke Program Early CT Score) - Ganglionic level infarction (caudate, lentiform nuclei, internal capsule, insula, M1-M3 cortex): 7 - Supraganglionic infarction (M4-M6 cortex): 2 Total score (0-10 with 10 being normal): 9 IMPRESSION: 1. Subtle evolving hypodensity involving the supra ganglionic posterior left frontal lobe (M5 distribution), suspicious for evolving acute ischemic left MCA territory infarct. No associated hemorrhage. 2. ASPECTS is 9. 3. Atrophy with severe chronic microvascular ischemic disease with remote left cerebellar infarcts. Critical Value/emergent results were called by telephone at the time of interpretation on 11/30/2017 at 5:25 pm to Dr. Erlinda Hong , who verbally acknowledged these results. Electronically Signed   By: Jeannine Boga M.D.   On: 11/30/2017 17:32   MRI pending  TTE pending   PHYSICAL EXAM  Temp:  [97.4 F (36.3 C)-99.1 F (37.3 C)] 97.7 F (36.5 C) (09/10 1200) Pulse Rate:  [36-135] 63 (09/10 1130) Resp:  [14-39] 23 (09/10 1130) BP:  (120-189)/(62-165) 138/106 (09/10 1130) SpO2:  [93 %-100 %] 97 % (09/10 1130) Weight:  [41 kg-41.1 kg] 41.1 kg (09/10 0100)  General - Well nourished, well developed, mild agitation.  Ophthalmologic - fundi not visualized due to noncooperation.  Cardiovascular - irregularly irregular heart  rate and rhythm, tele showed afib RVR.  Neuro - awake alert, eyes open, word salad but able to have say "I am fine" but then perseverated to it. Follows central commands with eye closing and tongue out but not peripheral commands. Left gaze preference, barely cross midline. Right neglect, not blinking to visual threat on the right. Tongue midline. PERRL. Moving all extremities equally, but 4/5 BUEs and 3+/5 BLEs. .DTR 1+ and no babinski. Sensation, coordination and gait not tested.   ASSESSMENT/PLAN Ms. BETHANI BRUGGER is a 84 y.o. female with history of HTN, PAF on ASA admitted for left gaze and aphasia. TPA given.    Stroke:  left MCA infarct embolic secondary to afib not on AC  Resultant aphasia and left gaze  MRI  pending  CT likely left MCA cortical infarct  CTA head and neck no LVO but likely small left MCA branch occlusion, 40% right ICA stenosis, 50% right VA origin stenosis and left VA hypoplastic. B/l fetal PCAs  2D Echo  pending  LDL 95  HgbA1c 5.6  SCDs for VTE prophylaxis  On diet  aspirin 81 mg daily prior to admission, now on No antithrombotic within 24h of tPA  Patient will be counseled to be compliant with her antithrombotic medications  Ongoing aggressive stroke risk factor management  Therapy recommendations:  pending  Disposition:  Pending  PAF  Know PAF since 08/2017  Had cardiology follow up  Determined not good AC candidate due to advanced age and falls  This am afib with RVR  Put on low dose metoprolol  May consider DOACs pending MRI  Hypertension Stable on cleviprex Permissive hypertension (OK if <180/105) for 24-48 hours post stroke and then  gradually normalized within 5-7 days.  Long term BP goal normotensive  Wean off cleviprex as able  On home amlodipine 2.5mg   Add metoprolol 25mg  bid  Hyperlipidemia  Home meds:  none   LDL 95, goal < 70  Now on pravastatin 20  Continue statin at discharge  Other Stroke Risk Factors  Advanced age  Other Active Problems  Leukocytosis 11.3->11.5  Lives independent living [facility]  Hospital day # 1  This patient is critically ill due to stroke s/p tPA, afib RVR, HTN and at significant risk of neurological worsening, death form recurrent stroke, hemorrhagic conversion, heart failure, seizure. This patient's care requires constant monitoring of vital signs, hemodynamics, respiratory and cardiac monitoring, review of multiple databases, neurological assessment, discussion with family, other specialists and medical decision making of high complexity. I spent 40 minutes of neurocritical care time in the care of this patient. I had long discussion with daughter at bedside, updated pt current condition, treatment plan and potential prognosis. She expressed understanding and appreciation.     Rosalin Hawking, MD PhD Stroke Neurology 12/01/2017 12:43 PM    To contact Stroke Continuity provider, please refer to [email or web address] After hours, contact General Neurology

## 2017-12-01 NOTE — Consult Note (Signed)
Physical Medicine and Rehabilitation Consult Reason for Consult: Left gaze with aphasia Referring Physician: Dr.Xu   HPI: [name] is a 84 y.o. right-handed female with history of CVA 2 years ago, hypertension, PAF maintained on aspirin.  Per chart review patient is a resident of [place] [facility].  Independent with assistive device prior to admission.  Presented 11/30/2017 with aphasia and left gaze.  Cranial CT scan showed subtle evolving hypodensity involving the supra ganglionic posterior left frontal lobe suspicious for evolving acute ischemic left MCA territory infarction.  Patient did receive TPA.  CT Angie of head and neck negative for large vessel occlusion.  MRI of the brain and echocardiogram pending.  Neurology consulted with work-up ongoing.  Presently on Cleviprex for blood pressure control.  Mechanical soft diet with thin liquids.  Therapy evaluation completed with recommendations of physical medicine rehab consult.   Review of Systems  Unable to perform ROS: Acuity of condition   Past Medical History:  Diagnosis Date  . Acid reflux   . Cervical dysplasia   . Cystocele   . Heart valve disorder    "leaking"  . Hypertension   . Osteoporosis   . Rectocele   . Stroke [facility])    mild strokes  . Urinary incontinence    Past Surgical History:  Procedure Laterality Date  . APPENDECTOMY    . BLADDER SUSPENSION    . CHOLECYSTECTOMY  2003  . COLPOSCOPY    . INGUINAL HERNIA REPAIR    . PROLAPSED INTESTINES    . SURGERY FOR ADHESIONS RELATED TO INTESTINES    . TOTAL ABDOMINAL HYSTERECTOMY  1991   TAH, A/P REPAIR  . TOTAL HIP ARTHROPLASTY     X2   Family History  Problem Relation Age of Onset  . Cancer Mother        COLON  . Hypertension Father   . Heart disease Father   . Heart disease Sister   . Emphysema Brother    Social History:  reports that she has never smoked. She has never used smokeless tobacco. She reports that she drinks about  4.0 standard drinks of alcohol per week. She reports that she does not use drugs. Allergies:  Allergies  Allergen Reactions  . Penicillins Anaphylaxis    Has patient had a PCN reaction causing immediate rash, facial/tongue/throat swelling, SOB or lightheadedness with hypotension: Yes Has patient had a PCN reaction causing severe rash involving mucus membranes or skin necrosis: Unknown Has patient had a PCN reaction that required hospitalization: No Has patient had a PCN reaction occurring within the last 10 years: No If all of the above answers are "NO", then may proceed with Cephalosporin use.   . Quinine Derivatives Other (See Comments)    unknown  . Aspirin Rash    "full strength"   Medications Prior to Admission  Medication Sig Dispense Refill  . acetaminophen (TYLENOL) 500 MG tablet Take 1 tablet (500 mg total) by mouth 3 (three) times daily. Take 1 tablet (500mg ) 3 times daily x 5 days, then every 6 hours as needed. (Patient taking differently: Take 500 mg by mouth 2 (two) times daily. Take 1 tablet (500mg ) 3 times daily x 5 days, then every 6 hours as needed.) 30 tablet 0  . amLODipine (NORVASC) 2.5 MG tablet Take 2.5 mg by mouth daily.    Marland Kitchen aspirin 81 MG tablet Take 1 tablet (81 mg total) by mouth See admin instructions. Every 3 days (Patient taking  differently: Take 81 mg by mouth daily. ) 30 tablet   . ENSURE PLUS (ENSURE PLUS) LIQD Take 237 mLs by mouth 3 (three) times daily between meals.    Marland Kitchen losartan (COZAAR) 100 MG tablet Take 100 mg by mouth daily.    Marland Kitchen omeprazole (PRILOSEC) 20 MG capsule Take 1 capsule (20 mg total) by mouth daily. (Patient taking differently: Take 20 mg by mouth 2 (two) times daily before a meal. ) 30 capsule 0  . Polyvinyl Alcohol-Povidone (REFRESH OP) Place 1 drop into both eyes 4 (four) times daily as needed. Take every day per patient     . trimethoprim (TRIMPEX) 100 MG tablet Take 100 mg by mouth at bedtime.  12  . feeding supplement (BOOST / RESOURCE  BREEZE) LIQD Take 1 Container by mouth 3 (three) times daily between meals. (Patient not taking: Reported on 11/30/2017)  0  . pramoxine (PROCTOFOAM) 1 % foam Place rectally 3 (three) times daily as needed for itching. (Patient not taking: Reported on 11/30/2017) 15 g 0    Home: Home Living Family/patient expects to be discharged to:: Assisted living Living Arrangements: Alone Available Help at Discharge: Available PRN/intermittently Type of Home: Independent living facility Home Access: Level entry Home Layout: One level Bathroom Shower/Tub: Multimedia programmer: Standard Home Equipment: Environmental consultant - 2 wheels, Cane - single point  Functional History: Prior Function Level of Independence: Independent with assistive device(s) Comments: completes ADL's and IADL's (including bill management and medication management) and dtr checks up on her. Functional Status:  Mobility: Bed Mobility Overal bed mobility: Needs Assistance Bed Mobility: Supine to Sit Supine to sit: Min assist General bed mobility comments: including minimal assist for scooting and stability.  pt appeared fearful at EOB eventhough blocking/bracing employed to make pt more comfortable that she was not going into the floor. Transfers Overall transfer level: Needs assistance Equipment used: Rolling walker (2 wheeled) Transfers: Sit to/from Stand, [employer] Transfers Sit to Stand: Min assist Stand pivot transfers: Min assist General transfer comment: multimodal cues for hand placement, direction and assist to come forward and up. Ambulation/Gait General Gait Details: pivotal transfer only    ADL: ADL Overall ADL's : Needs assistance/impaired Eating/Feeding: Moderate assistance, Sitting Grooming: Moderate assistance, Sitting(supported sitting) Upper Body Bathing: Moderate assistance, Sitting Lower Body Bathing: Maximal assistance, Sit to/from stand Upper Body Dressing : Moderate assistance Lower Body Dressing:  Maximal assistance, Sit to/from stand Toilet Transfer: Minimal assistance, Stand-pivot(simulated to recliner) Functional mobility during ADLs: Minimal assistance, Rolling walker General ADL Comments: Pt presenting with decreased strength, functional use of RUE, vision deficits, cognitive deficits, and poor balance.   Cognition: Cognition Overall Cognitive Status: Impaired/Different from baseline Arousal/Alertness: Awake/alert Orientation Level: Disoriented X4 Attention: Focused Focused Attention: Impaired Focused Attention Impairment: Verbal basic Awareness: Impaired Awareness Impairment: Intellectual impairment Cognition Arousal/Alertness: Awake/alert Behavior During Therapy: WFL for tasks assessed/performed Overall Cognitive Status: Impaired/Different from baseline Area of Impairment: Attention, Following commands, Safety/judgement, Awareness, Problem solving Current Attention Level: Sustained Following Commands: Follows one step commands inconsistently, Follows one step commands with increased time Safety/Judgement: Decreased awareness of safety, Decreased awareness of deficits Awareness: Intellectual Problem Solving: Slow processing, Requires verbal cues, Requires tactile cues General Comments: Pt requiring visual, tactile, and verbal cues for following commands. Difficult to assess with aphasia  Blood pressure (!) 145/78, pulse 85, temperature 97.7 F (36.5 C), temperature source Oral, resp. rate (!) 21, weight 41.1 kg, SpO2 96 %. Physical Exam  Vitals reviewed. Constitutional:  Frail appearing  HENT:  Head: Normocephalic and atraumatic.  Eyes: Pupils are equal, round, and reactive to light.  Pupils reactive to light  Neck: Normal range of motion. Neck supple. No thyromegaly present.  Cardiovascular:  Cardiac rate controlled  Respiratory: Effort normal and breath sounds normal. No respiratory distress.  GI: Soft. Bowel sounds are normal. She exhibits no distension. There  is no tenderness.  Musculoskeletal: She exhibits no edema.  Neurological: She is alert.  Pt with inconsistent language but can answer appropriately on a rare occasion. Mixed expressive and receptive language deficits. Follows simple one step commands less than 25 percent. Moves left side preferraly over right. Senses more pain in left than right. No resting tone.   Psychiatric: She has a normal mood and affect. Her behavior is normal.    Results for orders placed or performed during the hospital encounter of 11/30/17 (from the past 24 hour(s))  CBG monitoring, ED     Status: Abnormal   Collection Time: 11/30/17  4:58 PM  Result Value Ref Range   Glucose-Capillary 104 (H) 70 - 99 mg/dL   Comment 1 Notify RN    Comment 2 Document in Chart   I-stat troponin, ED     Status: Abnormal   Collection Time: 11/30/17  5:04 PM  Result Value Ref Range   Troponin i, poc 0.11 (HH) 0.00 - 0.08 ng/mL   Comment 3          I-Stat Chem 8, ED     Status: Abnormal   Collection Time: 11/30/17  5:06 PM  Result Value Ref Range   Sodium 126 (L) 135 - 145 mmol/L   Potassium 3.5 3.5 - 5.1 mmol/L   Chloride 91 (L) 98 - 111 mmol/L   BUN 26 (H) 8 - 23 mg/dL   Creatinine, Ser 0.90 0.44 - 1.00 mg/dL   Glucose, Bld 105 (H) 70 - 99 mg/dL   Calcium, Ion 1.03 (L) 1.15 - 1.40 mmol/L   TCO2 24 22 - 32 mmol/L   Hemoglobin 13.9 12.0 - 15.0 g/dL   HCT 41.0 36.0 - 46.0 %  Urinalysis, Complete w Microscopic     Status: Abnormal   Collection Time: 11/30/17  7:37 PM  Result Value Ref Range   Color, Urine YELLOW YELLOW   APPearance HAZY (A) CLEAR   Specific Gravity, Urine 1.036 (H) 1.005 - 1.030   pH 5.0 5.0 - 8.0   Glucose, UA NEGATIVE NEGATIVE mg/dL   Hgb urine dipstick SMALL (A) NEGATIVE   Bilirubin Urine NEGATIVE NEGATIVE   Ketones, ur NEGATIVE NEGATIVE mg/dL   Protein, ur 30 (A) NEGATIVE mg/dL   Nitrite NEGATIVE NEGATIVE   Leukocytes, UA MODERATE (A) NEGATIVE   RBC / HPF 0-5 0 - 5 RBC/hpf   WBC, UA 11-20 0 - 5  WBC/hpf   Bacteria, UA RARE (A) NONE SEEN   Squamous Epithelial / LPF 0-5 0 - 5   Hyaline Casts, UA PRESENT   Urine rapid drug screen (hosp performed)     Status: None   Collection Time: 11/30/17  7:37 PM  Result Value Ref Range   Opiates NONE DETECTED NONE DETECTED   Cocaine NONE DETECTED NONE DETECTED   Benzodiazepines NONE DETECTED NONE DETECTED   Amphetamines NONE DETECTED NONE DETECTED   Tetrahydrocannabinol NONE DETECTED NONE DETECTED   Barbiturates NONE DETECTED NONE DETECTED  Ethanol     Status: None   Collection Time: 11/30/17  8:31 PM  Result Value Ref Range   Alcohol, Ethyl (B) <10 <10 mg/dL  Protime-INR     Status: None   Collection Time: 11/30/17  8:31 PM  Result Value Ref Range   Prothrombin Time 13.8 11.4 - 15.2 seconds   INR 1.07   APTT     Status: None   Collection Time: 11/30/17  8:31 PM  Result Value Ref Range   aPTT 28 24 - 36 seconds  CBC     Status: Abnormal   Collection Time: 11/30/17  8:31 PM  Result Value Ref Range   WBC 11.3 (H) 4.0 - 10.5 K/uL   RBC 4.15 3.87 - 5.11 MIL/uL   Hemoglobin 13.3 12.0 - 15.0 g/dL   HCT 39.2 36.0 - 46.0 %   MCV 94.5 78.0 - 100.0 fL   MCH 32.0 26.0 - 34.0 pg   MCHC 33.9 30.0 - 36.0 g/dL   RDW 13.7 11.5 - 15.5 %   Platelets 328 150 - 400 K/uL  Differential     Status: Abnormal   Collection Time: 11/30/17  8:31 PM  Result Value Ref Range   Neutrophils Relative % 72 %   Neutro Abs 8.1 (H) 1.7 - 7.7 K/uL   Lymphocytes Relative 20 %   Lymphs Abs 2.3 0.7 - 4.0 K/uL   Monocytes Relative 8 %   Monocytes Absolute 0.9 0.1 - 1.0 K/uL   Eosinophils Relative 0 %   Eosinophils Absolute 0.0 0.0 - 0.7 K/uL   Basophils Relative 0 %   Basophils Absolute 0.0 0.0 - 0.1 K/uL   Immature Granulocytes 0 %   Abs Immature Granulocytes 0.0 0.0 - 0.1 K/uL  Comprehensive metabolic panel     Status: Abnormal   Collection Time: 11/30/17  8:31 PM  Result Value Ref Range   Sodium 128 (L) 135 - 145 mmol/L   Potassium 3.7 3.5 - 5.1 mmol/L    Chloride 90 (L) 98 - 111 mmol/L   CO2 25 22 - 32 mmol/L   Glucose, Bld 97 70 - 99 mg/dL   BUN 24 (H) 8 - 23 mg/dL   Creatinine, Ser 1.02 (H) 0.44 - 1.00 mg/dL   Calcium 8.7 (L) 8.9 - 10.3 mg/dL   Total Protein 6.5 6.5 - 8.1 g/dL   Albumin 3.4 (L) 3.5 - 5.0 g/dL   AST 30 15 - 41 U/L   ALT 14 0 - 44 U/L   Alkaline Phosphatase 76 38 - 126 U/L   Total Bilirubin 1.2 0.3 - 1.2 mg/dL   GFR calc non Af Amer 45 (L) >60 mL/min   GFR calc Af Amer 52 (L) >60 mL/min   Anion gap 13 5 - 15  MRSA PCR Screening     Status: None   Collection Time: 12/01/17 12:15 AM  Result Value Ref Range   MRSA by PCR NEGATIVE NEGATIVE  Hemoglobin A1c     Status: None   Collection Time: 12/01/17  3:21 AM  Result Value Ref Range   Hgb A1c MFr Bld 5.6 4.8 - 5.6 %   Mean Plasma Glucose 114.02 mg/dL  Lipid panel     Status: None   Collection Time: 12/01/17  3:21 AM  Result Value Ref Range   Cholesterol 178 0 - 200 mg/dL   Triglycerides 94 <150 mg/dL   HDL 64 >40 mg/dL   Total CHOL/HDL Ratio 2.8 RATIO   VLDL 19 0 - 40 mg/dL   LDL Cholesterol 95 0 - 99 mg/dL  CBC     Status: Abnormal   Collection Time: 12/01/17  3:21 AM  Result  Value Ref Range   WBC 11.5 (H) 4.0 - 10.5 K/uL   RBC 4.15 3.87 - 5.11 MIL/uL   Hemoglobin 13.1 12.0 - 15.0 g/dL   HCT 38.3 36.0 - 46.0 %   MCV 92.3 78.0 - 100.0 fL   MCH 31.6 26.0 - 34.0 pg   MCHC 34.2 30.0 - 36.0 g/dL   RDW 13.5 11.5 - 15.5 %   Platelets 295 150 - 400 K/uL  Basic metabolic panel     Status: Abnormal   Collection Time: 12/01/17  3:21 AM  Result Value Ref Range   Sodium 131 (L) 135 - 145 mmol/L   Potassium 3.3 (L) 3.5 - 5.1 mmol/L   Chloride 93 (L) 98 - 111 mmol/L   CO2 25 22 - 32 mmol/L   Glucose, Bld 99 70 - 99 mg/dL   BUN 17 8 - 23 mg/dL   Creatinine, Ser 0.83 0.44 - 1.00 mg/dL   Calcium 8.3 (L) 8.9 - 10.3 mg/dL   GFR calc non Af Amer 58 (L) >60 mL/min   GFR calc Af Amer >60 >60 mL/min   Anion gap 13 5 - 15   Ct Angio Head W Or Wo Contrast  Result  Date: 11/30/2017 CLINICAL DATA:  Initial evaluation for acute stroke, left-sided gaze, aphasia. EXAM: CT ANGIOGRAPHY HEAD AND NECK TECHNIQUE: Multidetector CT imaging of the head and neck was performed using the standard protocol during bolus administration of intravenous contrast. Multiplanar CT image reconstructions and MIPs were obtained to evaluate the vascular anatomy. Carotid stenosis measurements (when applicable) are obtained utilizing NASCET criteria, using the distal internal carotid diameter as the denominator. CONTRAST:  56mL ISOVUE-370 IOPAMIDOL (ISOVUE-370) INJECTION 76% COMPARISON:  Prior noncontrast head CT from earlier the same day. FINDINGS: CTA NECK FINDINGS Aortic arch: Visualized aortic arch of normal caliber with normal branch pattern. Extensive atheromatous plaque throughout the visualized arch and about the origin of the great vessels without hemodynamically significant stenosis. Partially visualized subclavian arteries patent bilaterally. Right carotid system: Right common carotid artery tortuous proximally but patent to the bifurcation without stenosis. Scattered calcified plaque about the right bifurcation with associated stenosis of up to 40% by NASCET criteria. Right ICA widely patent distally to the skull base without stenosis, dissection, or occlusion. Left carotid system: Left common carotid artery tortuous proximally but widely patent to the bifurcation without stenosis. Calcified eccentric plaque about the left bifurcation without hemodynamically significant stenosis. Left ICA patent distally to the skull base without stenosis, dissection, or occlusion. Vertebral arteries: Vertebral arteries appear to arise from the subclavian arteries bilaterally. Right vertebral artery dominant, with a diffusely hypoplastic left vertebral artery. Left vertebral artery essentially occluded at its origin, with minimal scant flow within the neck. There is some distal reconstitution at the distal left  V2/V3 segment, likely via muscular branches. Atheromatous plaque at the origin of the right vertebral artery with associated moderate approximate 50% stenosis. Right vert towards a sponge otherwise patent distally within the neck. Skeleton: No acute osseous abnormality. No worrisome lytic or blastic osseous lesions. C3-4 and C5-6 vertebral bodies are ankylosed. Other neck: No acute soft tissue abnormality within the neck. Small sialolith noted within the left submandibular gland. No adenopathy. Upper chest: Visualized upper chest demonstrates no acute abnormality. Partially visualized lungs are clear. Review of the MIP images confirms the above findings CTA HEAD FINDINGS Anterior circulation: Petrous segments patent bilaterally. Smooth atheromatous plaque within the cavernous/supraclinoid ICAs without significant stenosis. ICA termini widely patent. Patent right A1 segment. Hypoplastic  and/or absent left A1, accounting for the diminutive left ICA is compared to the right. Patent anterior communicating artery. Anterior cerebral arteries patent to their distal aspects without stenosis. Left M1 patent without stenosis. No proximal left M2 occlusion. Possible subtle distal small vessel occlusion at the superior aspect of the left sylvian fissure (series 7, image 79) Focal stenosis at the proximal right M1 segment with moderate diffuse narrowing distally. Normal right MCA bifurcation. No proximal right M2 occlusion. Distal right MCA branches well perfused. Distal small vessel atheromatous irregularity. Posterior circulation: Right vertebral artery dominant. Atheromatous plaque within the right V4 segment with associated moderate stenosis. Right for PICA patent. Attenuated flow within the distal left vertebral artery which is grossly patent to the takeoff of the left PICA. Left PICA grossly opacified proximally. Left vertebral artery essentially occluded beyond the left PICA. Basilar artery diminutive and mildly irregular  but patent to its distal aspect without stenosis. Superior cerebral arteries patent proximally. Predominant fetal type origin of the PCAs supplied via a prominent posterior communicating arteries. PCAs patent to their distal aspects. Venous sinuses: Patent. Anatomic variants: Predominant fetal type origin of the PCAs. Delayed phase: Not performed. Review of the MIP images confirms the above findings IMPRESSION: 1. Negative CTA for large vessel occlusion. Possible distal branch/small-vessel left MCA occlusion at the superior aspect of the left sylvian fissure as above. 2. Atheromatous plaque about the carotid bifurcations bilaterally, associated 40% stenosis on the right. No significant narrowing at the left bifurcation. 3. Approximate 50% atheromatous stenosis at the origin of the dominant right vertebral artery. Left vertebral artery largely occluded within the neck, with some distal reconstitution prior to the skull base. Vertebrobasilar system overall diminutive with fetal type origin of the PCAs bilaterally. 4. Additional mild-to-moderate age-related atheromatous disease as above. Results were discussed with Dr. Erlinda Hong, at 5:45 p.m. on 11/30/2017. Electronically Signed   By: Jeannine Boga M.D.   On: 11/30/2017 19:00   Ct Angio Neck W Or Wo Contrast  Result Date: 11/30/2017 CLINICAL DATA:  Initial evaluation for acute stroke, left-sided gaze, aphasia. EXAM: CT ANGIOGRAPHY HEAD AND NECK TECHNIQUE: Multidetector CT imaging of the head and neck was performed using the standard protocol during bolus administration of intravenous contrast. Multiplanar CT image reconstructions and MIPs were obtained to evaluate the vascular anatomy. Carotid stenosis measurements (when applicable) are obtained utilizing NASCET criteria, using the distal internal carotid diameter as the denominator. CONTRAST:  50mL ISOVUE-370 IOPAMIDOL (ISOVUE-370) INJECTION 76% COMPARISON:  Prior noncontrast head CT from earlier the same day.  FINDINGS: CTA NECK FINDINGS Aortic arch: Visualized aortic arch of normal caliber with normal branch pattern. Extensive atheromatous plaque throughout the visualized arch and about the origin of the great vessels without hemodynamically significant stenosis. Partially visualized subclavian arteries patent bilaterally. Right carotid system: Right common carotid artery tortuous proximally but patent to the bifurcation without stenosis. Scattered calcified plaque about the right bifurcation with associated stenosis of up to 40% by NASCET criteria. Right ICA widely patent distally to the skull base without stenosis, dissection, or occlusion. Left carotid system: Left common carotid artery tortuous proximally but widely patent to the bifurcation without stenosis. Calcified eccentric plaque about the left bifurcation without hemodynamically significant stenosis. Left ICA patent distally to the skull base without stenosis, dissection, or occlusion. Vertebral arteries: Vertebral arteries appear to arise from the subclavian arteries bilaterally. Right vertebral artery dominant, with a diffusely hypoplastic left vertebral artery. Left vertebral artery essentially occluded at its origin, with minimal scant flow within  the neck. There is some distal reconstitution at the distal left V2/V3 segment, likely via muscular branches. Atheromatous plaque at the origin of the right vertebral artery with associated moderate approximate 50% stenosis. Right vert towards a sponge otherwise patent distally within the neck. Skeleton: No acute osseous abnormality. No worrisome lytic or blastic osseous lesions. C3-4 and C5-6 vertebral bodies are ankylosed. Other neck: No acute soft tissue abnormality within the neck. Small sialolith noted within the left submandibular gland. No adenopathy. Upper chest: Visualized upper chest demonstrates no acute abnormality. Partially visualized lungs are clear. Review of the MIP images confirms the above  findings CTA HEAD FINDINGS Anterior circulation: Petrous segments patent bilaterally. Smooth atheromatous plaque within the cavernous/supraclinoid ICAs without significant stenosis. ICA termini widely patent. Patent right A1 segment. Hypoplastic and/or absent left A1, accounting for the diminutive left ICA is compared to the right. Patent anterior communicating artery. Anterior cerebral arteries patent to their distal aspects without stenosis. Left M1 patent without stenosis. No proximal left M2 occlusion. Possible subtle distal small vessel occlusion at the superior aspect of the left sylvian fissure (series 7, image 79) Focal stenosis at the proximal right M1 segment with moderate diffuse narrowing distally. Normal right MCA bifurcation. No proximal right M2 occlusion. Distal right MCA branches well perfused. Distal small vessel atheromatous irregularity. Posterior circulation: Right vertebral artery dominant. Atheromatous plaque within the right V4 segment with associated moderate stenosis. Right for PICA patent. Attenuated flow within the distal left vertebral artery which is grossly patent to the takeoff of the left PICA. Left PICA grossly opacified proximally. Left vertebral artery essentially occluded beyond the left PICA. Basilar artery diminutive and mildly irregular but patent to its distal aspect without stenosis. Superior cerebral arteries patent proximally. Predominant fetal type origin of the PCAs supplied via a prominent posterior communicating arteries. PCAs patent to their distal aspects. Venous sinuses: Patent. Anatomic variants: Predominant fetal type origin of the PCAs. Delayed phase: Not performed. Review of the MIP images confirms the above findings IMPRESSION: 1. Negative CTA for large vessel occlusion. Possible distal branch/small-vessel left MCA occlusion at the superior aspect of the left sylvian fissure as above. 2. Atheromatous plaque about the carotid bifurcations bilaterally, associated  40% stenosis on the right. No significant narrowing at the left bifurcation. 3. Approximate 50% atheromatous stenosis at the origin of the dominant right vertebral artery. Left vertebral artery largely occluded within the neck, with some distal reconstitution prior to the skull base. Vertebrobasilar system overall diminutive with fetal type origin of the PCAs bilaterally. 4. Additional mild-to-moderate age-related atheromatous disease as above. Results were discussed with Dr. Erlinda Hong, at 5:45 p.m. on 11/30/2017. Electronically Signed   By: Jeannine Boga M.D.   On: 11/30/2017 19:00   Dg Chest Port 1 View  Result Date: 12/01/2017 CLINICAL DATA:  84 year old female.  CHF.  Initial encounter. EXAM: PORTABLE CHEST 1 VIEW COMPARISON:  05/14/2015 chest x-ray. FINDINGS: Cardiomegaly. Calcified tortuous aorta. No infiltrate, congestive heart failure or pneumothorax. Prominent right shoulder joint degenerative changes. IMPRESSION: 1. Cardiomegaly. 2.  Aortic Atherosclerosis (ICD10-I70.0).  Calcified tortuous aorta 3. No infiltrate or congestive heart failure. 4. Evaluation of retrocardiac region slightly limited although appearing similar to prior exam. Electronically Signed   By: Genia Del M.D.   On: 12/01/2017 12:20   Ct Head Code Stroke Wo Contrast  Result Date: 11/30/2017 CLINICAL DATA:  Code stroke. Initial evaluation for acute aphasia, left-sided gaze. EXAM: CT HEAD WITHOUT CONTRAST TECHNIQUE: Contiguous axial images were obtained from the base  of the skull through the vertex without intravenous contrast. COMPARISON:  Prior CT from 05/14/2015. FINDINGS: Brain: Moderate cerebral atrophy. Severe chronic microvascular ischemic disease seen throughout the periventricular and deep white matter both cerebral hemispheres. Scattered remote lacunar infarcts within the bilateral basal ganglia. Remote left cerebellar infarcts noted as well. Subtle evolving hypodensity seen involving cortical gray matter at the supra  ganglionic posterior left frontal lobe, M5 distribution, inferior left precentral gyrus (images 23, 22). No other definite evidence for acute evolving ischemic infarct. No acute intracranial hemorrhage. No mass lesion, midline shift or mass effect. No hydrocephalus. No extra-axial fluid collection. Vascular: No hyperdense vessel. Calcified atherosclerosis at the skull base. Skull: Scalp soft tissues and calvarium within normal limits. Sinuses/Orbits: Left-sided gaze noted. Paranasal sinuses are clear. No mastoid effusion. Other: None. ASPECTS [facility] Stroke Program Early CT Score) - Ganglionic level infarction (caudate, lentiform nuclei, internal capsule, insula, M1-M3 cortex): 7 - Supraganglionic infarction (M4-M6 cortex): 2 Total score (0-10 with 10 being normal): 9 IMPRESSION: 1. Subtle evolving hypodensity involving the supra ganglionic posterior left frontal lobe (M5 distribution), suspicious for evolving acute ischemic left MCA territory infarct. No associated hemorrhage. 2. ASPECTS is 9. 3. Atrophy with severe chronic microvascular ischemic disease with remote left cerebellar infarcts. Critical Value/emergent results were called by telephone at the time of interpretation on 11/30/2017 at 5:25 pm to Dr. Erlinda Hong , who verbally acknowledged these results. Electronically Signed   By: Jeannine Boga M.D.   On: 11/30/2017 17:32     Assessment/Plan: Diagnosis: left MCA infarct with right hemiparesis and aphasia 1. Does the need for close, 24 hr/day medical supervision in concert with the patient's rehab needs make it unreasonable for this patient to be served in a less intensive setting? Yes 2. Co-Morbidities requiring supervision/potential complications: PAF, HTN, post-stroke sequelae 3. Due to bladder management, bowel management, safety, skin/wound care, disease management, medication administration and patient education, does the patient require 24 hr/day rehab nursing? Yes 4. Does the patient require  coordinated care of a physician, rehab nurse, PT (1-2 hrs/day, 5 days/week), OT (1-2 hrs/day, 5 days/week) and SLP (1-2 hrs/day, 5 days/week) to address physical and functional deficits in the context of the above medical diagnosis(es)? Yes Addressing deficits in the following areas: balance, endurance, locomotion, strength, transferring, bowel/bladder control, bathing, dressing, feeding, grooming, toileting, cognition, speech, language, swallowing and psychosocial support 5. Can the patient actively participate in an intensive therapy program of at least 3 hrs of therapy per day at least 5 days per week? Yes 6. The potential for patient to make measurable gains while on inpatient rehab is excellent 7. Anticipated functional outcomes upon discharge from inpatient rehab are supervision  with PT, supervision and min assist with OT, min assist and mod assist with SLP. 8. Estimated rehab length of stay to reach the above functional goals is: 13-18 days 9. Anticipated D/C setting: Home 10. Anticipated post D/C treatments: HH therapy and Outpatient therapy 11. Overall Rehab/Functional Prognosis: excellent  RECOMMENDATIONS: This patient's condition is appropriate for continued rehabilitative care in the following setting: CIR Patient has agreed to participate in recommended program. Potentially Note that insurance prior authorization may be required for reimbursement for recommended care.  Comment: I spoke with available family who can provide assist at discharge. Rehab Admissions Coordinator to follow up.  Thanks,  Meredith Staggers, MD, Mellody Drown  I have personally performed a face to face diagnostic evaluation of this patient. Additionally, I have reviewed and concur with the physician assistant's documentation above.  Lavon Paganini Angiulli, PA-C 12/01/2017

## 2017-12-01 NOTE — Progress Notes (Signed)
OT Cancellation Note  Patient Details Name: [name] MRN: [id number] DOB: 01/06/1920   Cancelled Treatment:    Reason Eval/Treat Not Completed: Active bedrest order(Will return as schedule allows. Thank you.)  [place], OTR/L Acute Rehab Pager: [phone] Office: [phone] 12/01/2017, 7:15 AM

## 2017-12-01 NOTE — Evaluation (Signed)
Physical Therapy Evaluation Patient Details Name: [name] MRN: [id number] DOB: 22-May-1919 Today's Date: 12/01/2017   History of Present Illness   84 y.o. female, resident [place] living, with PMH of CVA 2 years ago, HTN, PAF on ASA presented with forced left gaze and aphasia but no focal weakness. CT no bleeding; tPA given.   Clinical Impression  Pt admitted with/for stroke with global aphasia and L gaze preference.  Pt presenting with mild R weakness and incoordination and needing min assist for mobility.  Pt currently limited functionally due to the problems listed. ( See problems list.)   Pt will benefit from PT to maximize function and safety in order to get ready for next venue listed below.     Follow Up Recommendations CIR    Equipment Recommendations  None recommended by PT(TBA)    Recommendations for Other Services Rehab consult     Precautions / Restrictions Precautions Precautions: Fall Restrictions Weight Bearing Restrictions: No      Mobility  Bed Mobility Overal bed mobility: Needs Assistance Bed Mobility: Supine to Sit     Supine to sit: Min assist     General bed mobility comments: including minimal assist for scooting and stability.  pt appeared fearful at EOB eventhough blocking/bracing employed to make pt more comfortable that she was not going into the floor.  Transfers Overall transfer level: Needs assistance Equipment used: Rolling walker (2 wheeled) Transfers: Sit to/from [facility] Sit to Stand: Min assist Stand pivot transfers: Min assist       General transfer comment: multimodal cues for hand placement, direction and assist to come forward and up.  Ambulation/Gait             General Gait Details: pivotal transfer only  Financial trader Rankin (Stroke Patients Only) Modified Rankin (Stroke Patients Only) Pre-Morbid Rankin Score: No  symptoms Modified Rankin: Moderately severe disability     Balance Overall balance assessment: Needs assistance Sitting-balance support: No upper extremity supported;Single extremity supported Sitting balance-Leahy Scale: Poor Sitting balance - Comments: pt anxious sitting EOB, improved with support   Standing balance support: Single extremity supported Standing balance-Leahy Scale: Poor Standing balance comment: reliant on external support                             Pertinent Vitals/Pain Pain Assessment: Faces Faces Pain Scale: Hurts little more Pain Location: generalized Pain Descriptors / Indicators: Grimacing;Guarding Pain Intervention(s): Monitored during session;Limited activity within patient's tolerance;Repositioned    Home Living Family/patient expects to be discharged to:: Assisted living Living Arrangements: Alone Available Help at Discharge: Available PRN/intermittently Type of Home: Independent living facility Home Access: Level entry     Home Layout: One level Home Equipment: Walker - 2 wheels;Cane - single point      Prior Function Level of Independence: Independent with assistive device(s)         Comments: completes ADL's and IADL's (including bill management and medication management) and dtr checks up on her.     Hand Dominance   Dominant Hand: Right    Extremity/Trunk Assessment   Upper Extremity Assessment Upper Extremity Assessment: RUE deficits/detail RUE Deficits / Details: Daughter reports shoulder arthritis and injury PTA. Pt with decreased grasp strength and coordination. Pt demonstrating increased coorindation with automatic movements such as bringing her right hand to scratch her nose.  However, requiring increased cues and time to perform ROM activity demonstrating decreased initiation. RUE Coordination: decreased gross motor;decreased fine motor    Lower Extremity Assessment Lower Extremity Assessment: Defer to PT  evaluation RLE Deficits / Details: mild weakness at >3+, mild incoordination RLE Coordination: decreased fine motor    Cervical / Trunk Assessment Cervical / Trunk Assessment: Kyphotic  Communication   Communication: Expressive difficulties;Receptive difficulties  Cognition Arousal/Alertness: Awake/alert Behavior During Therapy: WFL for tasks assessed/performed Overall Cognitive Status: Impaired/Different from baseline Area of Impairment: Attention;Following commands;Safety/judgement;Awareness;Problem solving                   Current Attention Level: Sustained   Following Commands: Follows one step commands inconsistently;Follows one step commands with increased time Safety/Judgement: Decreased awareness of safety;Decreased awareness of deficits Awareness: Intellectual Problem Solving: Slow processing;Requires verbal cues;Requires tactile cues General Comments: Pt requiring visual, tactile, and verbal cues for following commands. Difficult to assess with aphasia      General Comments General comments (skin integrity, edema, etc.): VSS. HR elevating to 127 during mobility. SpO2 90s throughout on RA. Daughter present during session    Exercises     Assessment/Plan    PT Assessment Patient needs continued PT services  PT Problem List Decreased strength;Decreased balance;Decreased mobility;Decreased coordination;Decreased safety awareness       PT Treatment Interventions Gait training;DME instruction;Functional mobility training;Therapeutic activities;Balance training;Neuromuscular re-education;Patient/family education    PT Goals (Current goals can be found in the Care Plan section)  Acute Rehab PT Goals Patient Stated Goal: Return to PLOF PT Goal Formulation: With patient Time For Goal Achievement: 12/15/17 Potential to Achieve Goals: Good    Frequency Min 3X/week   Barriers to discharge        Co-evaluation               AM-PAC PT "6 Clicks" Daily  Activity  Outcome Measure Difficulty turning over in bed (including adjusting bedclothes, sheets and blankets)?: Unable Difficulty moving from lying on back to sitting on the side of the bed? : Unable Difficulty sitting down on and standing up from a chair with arms (e.g., wheelchair, bedside commode, etc,.)?: Unable Help needed moving to and from a bed to chair (including a wheelchair)?: A Little Help needed walking in hospital room?: A Little Help needed climbing 3-5 steps with a railing? : A Little 6 Click Score: 12    End of Session   Activity Tolerance: Patient tolerated treatment well Patient left: in chair;with call bell/phone within reach;with chair alarm set;with family/visitor present Nurse Communication: Mobility status PT Visit Diagnosis: Unsteadiness on feet (R26.81);Hemiplegia and hemiparesis Hemiplegia - Right/Left: Right Hemiplegia - dominant/non-dominant: Dominant Hemiplegia - caused by: Cerebral infarction    Time: 8177-1165 PT Time Calculation (min) (ACUTE ONLY): 28 min   Charges:   PT Evaluation $PT Eval Moderate Complexity: 1 Mod          12/01/2017  Donnella Sham, PT Acute Rehabilitation Services [phone]  (pager) [phone]  (office)  Tessie Fass Shyann Hefner 12/01/2017, 2:13 PM

## 2017-12-01 NOTE — Evaluation (Signed)
Clinical/Bedside Swallow Evaluation Patient Details  Name: [name] MRN: [id number] Date of Birth: June 03, 1919  Today's Date: 12/01/2017 Time: SLP Start Time (ACUTE ONLY): 1000 SLP Stop Time (ACUTE ONLY): 1015 SLP Time Calculation (min) (ACUTE ONLY): 15 min  Past Medical History:  Past Medical History:  Diagnosis Date  . Acid reflux   . Cervical dysplasia   . Cystocele   . Heart valve disorder    "leaking"  . Hypertension   . Osteoporosis   . Rectocele   . Stroke [facility])    mild strokes  . Urinary incontinence    Past Surgical History:  Past Surgical History:  Procedure Laterality Date  . APPENDECTOMY    . BLADDER SUSPENSION    . CHOLECYSTECTOMY  2003  . COLPOSCOPY    . INGUINAL HERNIA REPAIR    . PROLAPSED INTESTINES    . SURGERY FOR ADHESIONS RELATED TO INTESTINES    . TOTAL ABDOMINAL HYSTERECTOMY  1991   TAH, A/P REPAIR  . TOTAL HIP ARTHROPLASTY     X2   HPI:   84 y.o. female, resident [place] living, with PMH of CVA 2 years ago, HTN, PAF on ASA presented with forced left gaze and aphasia but no focal weakness. CT no bleeding; tPA given.  Large HH per daughter; pt fearful of eating PTA, eats slowly, coughs with meals at baseline.     Assessment / Plan / Recommendation Clinical Impression    Pt presents with a mild dysphagia, some of which may be baseline, with adequate attention to POs, functional mastication, mild throat-clearing and occasional mild delayed cough after PO intake.  Coughing was infrequent and was absent by end of session. Daughter describes current swallowing/eating as similar to baseline behavior.  For now, start dysphagia 3 diet; allow thin liquids; meds whole in puree.  Hold liquids and call SLP if coughing persists with meal.  D/W RN.    SLP Visit Diagnosis: Dysphagia, unspecified (R13.10)    Aspiration Risk  Mild aspiration risk    Diet Recommendation   Dysphagia 3, thin liquids  Medication Administration: Whole meds  with puree    Other  Recommendations Oral Care Recommendations: Oral care BID   Follow up Recommendations Other (comment)(tba)      Frequency and Duration min 3x week  2 weeks       Prognosis Prognosis for Safe Diet Advancement: Good Barriers to Reach Goals: Language deficits      Swallow Study   General Date of Onset: 11/30/17 HPI:  84 y.o. female, resident [place] living, with PMH of CVA 2 years ago, HTN, PAF on ASA presented with forced left gaze and aphasia but no focal weakness. CT no bleeding; tPA given.  Large HH per daughter; pt fearful of eating PTA, eats slowly, coughs with meals at baseline.   Type of Study: Bedside Swallow Evaluation Previous Swallow Assessment: no Diet Prior to this Study: NPO Temperature Spikes Noted: No Respiratory Status: Room air History of Recent Intubation: No Behavior/Cognition: Alert Oral Cavity Assessment: Within Functional Limits Oral Care Completed by SLP: No Oral Cavity - Dentition: Adequate natural dentition Vision: Impaired for self-feeding Self-Feeding Abilities: Needs assist Patient Positioning: Upright in bed Baseline Vocal Quality: Normal Volitional Cough: Cognitively unable to elicit Volitional Swallow: Unable to elicit    Oral/Motor/Sensory Function Overall Oral Motor/Sensory Function: Within functional limits   Ice Chips Ice chips: Within functional limits   Thin Liquid Thin Liquid: Impaired Presentation: Cup;Self Fed Pharyngeal  Phase Impairments:  Cough - Delayed    Nectar Thick Nectar Thick Liquid: Not tested   Honey Thick Honey Thick Liquid: Not tested   Puree Puree: Within functional limits   Solid     Solid: Not tested      Juan Quam Laurice 12/01/2017,10:50 AM Estill Bamberg L. Tivis Ringer, [place] Office [phone]

## 2017-12-01 NOTE — Progress Notes (Signed)
Rehab Admissions Coordinator Note:  Patient was screened by Cleatrice Burke for appropriateness for an Inpatient Acute Rehab Consult per OT and SLP recommendation.   At this time, we are recommending Inpatient Rehab consult.  Cleatrice Burke 12/01/2017, 2:11 PM  I can be reached at [phone].

## 2017-12-01 NOTE — Evaluation (Addendum)
Occupational Therapy Evaluation Patient Details Name: [name] MRN: [id number] DOB: 03/19/1920 Today's Date: 12/01/2017    History of Present Illness  84 y.o. female, resident [facility], with PMH of CVA 2 years ago, HTN, PAF on ASA presented with forced left gaze and aphasia but no focal weakness. CT showing supra ganglionic posterior left frontal lobe with suspicious for acute ischemic left MCA; tPA given.    Clinical Impression   PTA, pt was living at an assisted living and performing ADLs and IADLs. Pt currently requiring Mod A for UB ADLs, Max A for LB ADLs, and Min A for functional transfers with Max cues. Pt presenting with aphasia, poor balance, and decreased functional use of RUE (dominant hand) with decreased strength, coordination, and processing. Pt with left gaze and head turn requiring Max cues to reach midline. Pt with good family support and motivated to participate in therapy. Pt will require further acute OT to facilitate safe dc. Recommend dc to CIR for further OT to optimize safety, increase independence with ADLs, decrease caregiver burden, and return to PLOF.      Follow Up Recommendations  CIR;Supervision/Assistance - 24 hour    Equipment Recommendations  Other (comment)(Defer to next venue)    Recommendations for Other Services Rehab consult;PT consult;Speech consult     Precautions / Restrictions Precautions Precautions: Fall Restrictions Weight Bearing Restrictions: No      Mobility Bed Mobility Overal bed mobility: Needs Assistance Bed Mobility: Supine to Sit     Supine to sit: Min assist     General bed mobility comments: including minimal assist for scooting and stability.  pt appeared fearful at EOB eventhough blocking/bracing employed to make pt more comfortable that she was not going into the floor.  Transfers Overall transfer level: Needs assistance Equipment used: Rolling walker (2 wheeled) Transfers: Sit  to/from [facility] Sit to Stand: Min assist Stand pivot transfers: Min assist       General transfer comment: multimodal cues for hand placement, direction and assist to come forward and up.    Balance                                           ADL either performed or assessed with clinical judgement   ADL Overall ADL's : Needs assistance/impaired Eating/Feeding: Moderate assistance;Sitting   Grooming: Moderate assistance;Sitting(supported sitting)   Upper Body Bathing: Moderate assistance;Sitting   Lower Body Bathing: Maximal assistance;Sit to/from stand   Upper Body Dressing : Moderate assistance   Lower Body Dressing: Maximal assistance;Sit to/from stand   Toilet Transfer: Minimal assistance;Stand-pivot(simulated to recliner)           Functional mobility during ADLs: Minimal assistance;Rolling walker General ADL Comments: Pt presenting with decreased strength, functional use of RUE, vision deficits, cognitive deficits, and poor balance.      Vision Baseline Vision/History: Wears glasses Wears Glasses: Reading only Patient Visual Report: Other (comment)(Difficult to collect due to cognition and aphagia) Vision Assessment?: Yes Eye Alignment: Within Functional Limits Ocular Range of Motion: Restricted on the right Alignment/Gaze Preference: Gaze left;Head turned Additional Comments: Pt with left eye gaze and head turn. Pt able to bring head to right with Max verbal cues and able to bring gaze to midline with max visual and verbal cues. Noting left pulsating nystagmus seen when sitting at EOB. Will need further assessment  Perception     Praxis      Pertinent Vitals/Pain Pain Assessment: Faces Faces Pain Scale: Hurts little more Pain Location: generalized Pain Descriptors / Indicators: Grimacing;Guarding Pain Intervention(s): Monitored during session;Limited activity within patient's tolerance;Repositioned     Hand  Dominance Right   Extremity/Trunk Assessment Upper Extremity Assessment Upper Extremity Assessment: RUE deficits/detail RUE Deficits / Details: Daughter reports shoulder arthritis and injury PTA. Pt with decreased grasp strength and coordination. Pt demonstrating increased coorindation with automatic movements such as bringing her right hand to scratch her nose. However, requiring increased cues and time to perform ROM activity demonstrating decreased initiation. RUE Coordination: decreased gross motor;decreased fine motor   Lower Extremity Assessment Lower Extremity Assessment: Defer to PT evaluation RLE Deficits / Details: mild weakness at >3+, mild incoordination RLE Coordination: decreased fine motor   Cervical / Trunk Assessment Cervical / Trunk Assessment: Kyphotic   Communication Communication Communication: Expressive difficulties;Receptive difficulties   Cognition Arousal/Alertness: Awake/alert Behavior During Therapy: WFL for tasks assessed/performed Overall Cognitive Status: Impaired/Different from baseline Area of Impairment: Attention;Following commands;Safety/judgement;Awareness;Problem solving                   Current Attention Level: Sustained   Following Commands: Follows one step commands inconsistently;Follows one step commands with increased time Safety/Judgement: Decreased awareness of safety;Decreased awareness of deficits Awareness: Intellectual Problem Solving: Slow processing;Requires verbal cues;Requires tactile cues General Comments: Pt requiring visual, tactile, and verbal cues for following commands. Difficult to assess with aphasia   General Comments  VSS. HR elevating to 127 during mobility. SpO2 90s throughout on RA. Daughter present during session    Exercises     Shoulder Instructions      Home Living Family/patient expects to be discharged to:: Assisted living Living Arrangements: Alone Available Help at Discharge: Available  PRN/intermittently Type of Home: Independent living facility Home Access: Level entry     Home Layout: One level     Bathroom Shower/Tub: Occupational psychologist: Standard     Home Equipment: Environmental consultant - 2 wheels;Cane - single point          Prior Functioning/Environment Level of Independence: Independent with assistive device(s)        Comments: completes ADL's and IADL's (including bill management and medication management) and dtr checks up on her.        OT Problem List: Decreased strength;Decreased range of motion;Decreased activity tolerance;Impaired balance (sitting and/or standing);Impaired vision/perception;Decreased coordination;Decreased cognition;Decreased safety awareness;Decreased knowledge of use of DME or AE;Decreased knowledge of precautions;Pain;Impaired UE functional use      OT Treatment/Interventions: Self-care/ADL training;Therapeutic exercise;Energy conservation;DME and/or AE instruction;Therapeutic activities;Patient/family education    OT Goals(Current goals can be found in the care plan section) Acute Rehab OT Goals Patient Stated Goal: Return to PLOF OT Goal Formulation: With patient/family Time For Goal Achievement: 12/15/17 Potential to Achieve Goals: Good  OT Frequency: Min 3X/week   Barriers to D/C:            Co-evaluation              AM-PAC PT "6 Clicks" Daily Activity     Outcome Measure Help from another person eating meals?: A Little Help from another person taking care of personal grooming?: A Little Help from another person toileting, which includes using toliet, bedpan, or urinal?: A Lot Help from another person bathing (including washing, rinsing, drying)?: A Lot Help from another person to put on and taking off regular upper body clothing?: A Lot Help  from another person to put on and taking off regular lower body clothing?: A Lot 6 Click Score: 14   End of Session Equipment Utilized During Treatment:  Surveyor, mining Communication: Mobility status  Activity Tolerance: Patient tolerated treatment well Patient left: in chair;with call bell/phone within reach;with chair alarm set;with family/visitor present  OT Visit Diagnosis: Unsteadiness on feet (R26.81);Other abnormalities of gait and mobility (R26.89);Muscle weakness (generalized) (M62.81);Other symptoms and signs involving cognitive function;Pain;Low vision, both eyes (H54.2);Hemiplegia and hemiparesis Hemiplegia - Right/Left: Right Hemiplegia - dominant/non-dominant: Dominant Hemiplegia - caused by: Cerebral infarction Pain - part of body: (Back)                Time: 2863-8177 OT Time Calculation (min): 28 min Charges:  OT General Charges $OT Visit: 1 Visit OT Evaluation $OT Eval Moderate Complexity: [place], OTR/L Acute Rehab Pager: [phone] Office: [place] 12/01/2017, 1:50 PM

## 2017-12-01 NOTE — Evaluation (Signed)
Speech Language Pathology Evaluation Patient Details Name: [name] MRN: [id number] DOB: 1919-07-02 Today's Date: 12/01/2017 Time: 1015-1030 SLP Time Calculation (min) (ACUTE ONLY): 15 min  Problem List:  Patient Active Problem List   Diagnosis Date Noted  . Stroke (cerebrum) ([place]) 11/30/2017  . Hypokalemia 09/21/2015  . Hypomagnesemia 09/21/2015  . Acute lower GI bleeding 09/20/2015  . LLQ abdominal pain 09/20/2015  . Chronic hyponatremia 09/20/2015  . Diverticulosis of colon 09/20/2015  . Osteoporosis   . Cervical dysplasia   . Cystocele   . Rectocele   . GERD (gastroesophageal reflux disease)   . Urinary incontinence   . Moderate tricuspid regurgitation   . Hypertension   . History of CVA (cerebrovascular accident)    Past Medical History:  Past Medical History:  Diagnosis Date  . Acid reflux   . Cervical dysplasia   . Cystocele   . Heart valve disorder    "leaking"  . Hypertension   . Osteoporosis   . Rectocele   . Stroke [facility])    mild strokes  . Urinary incontinence    Past Surgical History:  Past Surgical History:  Procedure Laterality Date  . APPENDECTOMY    . BLADDER SUSPENSION    . CHOLECYSTECTOMY  2003  . COLPOSCOPY    . INGUINAL HERNIA REPAIR    . PROLAPSED INTESTINES    . SURGERY FOR ADHESIONS RELATED TO INTESTINES    . TOTAL ABDOMINAL HYSTERECTOMY  1991   TAH, A/P REPAIR  . TOTAL HIP ARTHROPLASTY     X2   HPI:   84 y.o. female, resident [facility] living, with PMH of CVA 2 years ago, HTN, PAF on ASA presented with forced left gaze and aphasia but no focal weakness. CT no bleeding; tPA given.  Large HH per daughter; pt fearful of eating PTA, eats slowly, coughs with meals at baseline.     Assessment / Plan / Recommendation Clinical Impression  Pt presents with a primary posterior aphasia with generalized deficits in comprehension, including command-following, yes/no reliability, and object discrimination.  Expressive  speech is marked by paraphasias and neologisms, minimal grammatical form with tendency to produce single words (nouns/verbs) in list form. When provided with a model, pt was able to count from 1-10, but then perseverated on numbers after presentation.  Demonstrates some use of social language (greetings, closings, saying thank you). Recommend CIR consult given level of independence PTA.  SLP will follow for aphasia therapy and education with daughter.     SLP Assessment  SLP Recommendation/Assessment: Patient needs continued Speech Lanaguage Pathology Services SLP Visit Diagnosis: Dysphagia, unspecified (R13.10)    Follow Up Recommendations  Inpatient Rehab    Frequency and Duration min 3x week  2 weeks      SLP Evaluation Cognition  Overall Cognitive Status: Impaired/Different from baseline Arousal/Alertness: Awake/alert Orientation Level: Disoriented X4 Attention: Focused Focused Attention: Impaired Focused Attention Impairment: Verbal basic Awareness: Impaired Awareness Impairment: Intellectual impairment       Comprehension  Auditory Comprehension Overall Auditory Comprehension: Impaired Yes/No Questions: Impaired Basic Immediate Environment Questions: 0-24% accurate Commands: Impaired One Step Basic Commands: 0-24% accurate Interfering Components: Attention;Processing speed EffectiveTechniques: Extra processing time Visual Recognition/Discrimination Discrimination: Exceptions to [facility] Common Objects: Unable to indentify Reading Comprehension Reading Status: Impaired    Expression Expression Primary Mode of Expression: Verbal Verbal Expression Overall Verbal Expression: Impaired Initiation: No impairment Automatic Speech: (able to count 1-10 after modeling) Level of Generative/Spontaneous Verbalization: Phrase Repetition: Impaired Level of Impairment: Word level  Naming: Impairment Responsive: 0-25% accurate Confrontation: Impaired Common Objects: Unable to  indentify Convergent: 0-24% accurate Written Expression Dominant Hand: Right Written Expression: Not tested   Oral / Motor  Oral Motor/Sensory Function Overall Oral Motor/Sensory Function: Within functional limits Motor Speech Overall Motor Speech: Appears within functional limits for tasks assessed   GO                    Juan Quam Laurice 12/01/2017, 11:15 AM   Estill Bamberg L. Tivis Ringer, [place] CCC/SLP Office [phone]

## 2017-12-02 DIAGNOSIS — I509 Heart failure, unspecified: Secondary | ICD-10-CM

## 2017-12-02 DIAGNOSIS — I481 Persistent atrial fibrillation: Secondary | ICD-10-CM

## 2017-12-02 DIAGNOSIS — E871 Hypo-osmolality and hyponatremia: Secondary | ICD-10-CM

## 2017-12-02 DIAGNOSIS — D72829 Elevated white blood cell count, unspecified: Secondary | ICD-10-CM

## 2017-12-02 LAB — BASIC METABOLIC PANEL
Anion gap: 17 — ABNORMAL HIGH (ref 5–15)
BUN: 11 mg/dL (ref 8–23)
CHLORIDE: 91 mmol/L — AB (ref 98–111)
CO2: 23 mmol/L (ref 22–32)
CREATININE: 0.94 mg/dL (ref 0.44–1.00)
Calcium: 8.6 mg/dL — ABNORMAL LOW (ref 8.9–10.3)
GFR calc Af Amer: 57 mL/min — ABNORMAL LOW (ref >60)
GFR calc non Af Amer: 50 mL/min — ABNORMAL LOW (ref >60)
GLUCOSE: 133 mg/dL — AB (ref 70–99)
Potassium: 2.8 mmol/L — ABNORMAL LOW (ref 3.5–5.1)
Sodium: 131 mmol/L — ABNORMAL LOW (ref 135–145)

## 2017-12-02 LAB — CBC
HCT: 41.1 % (ref 36.0–46.0)
Hemoglobin: 14.2 g/dL (ref 12.0–15.0)
MCH: 31.9 pg (ref 26.0–34.0)
MCHC: 34.5 g/dL (ref 30.0–36.0)
MCV: 92.4 fL (ref 78.0–100.0)
Platelets: 344 10*3/uL (ref 150–400)
RBC: 4.45 MIL/uL (ref 3.87–5.11)
RDW: 13.5 % (ref 11.5–15.5)
WBC: 13.5 10*3/uL — ABNORMAL HIGH (ref 4.0–10.5)

## 2017-12-02 MED ORDER — LABETALOL HCL 5 MG/ML IV SOLN: 5.0000 mg | INTRAVENOUS | Status: DC | PRN

## 2017-12-02 MED ORDER — QUETIAPINE FUMARATE 25 MG PO TABS
12.5000 mg | ORAL_TABLET | Freq: Every day | ORAL | Status: DC
  Administered 2017-12-02: 12.5 mg via ORAL
  Filled 2017-12-02: qty 1

## 2017-12-02 MED ORDER — LOSARTAN POTASSIUM 50 MG PO TABS
50.0000 mg | ORAL_TABLET | Freq: Every day | ORAL | Status: DC
  Administered 2017-12-02 – 2017-12-03 (×2): 50 mg via ORAL
  Filled 2017-12-02 (×2): qty 1

## 2017-12-02 MED ORDER — POTASSIUM CHLORIDE CRYS ER 20 MEQ PO TBCR
40.0000 meq | EXTENDED_RELEASE_TABLET | ORAL | Status: AC
  Administered 2017-12-02 (×2): 40 meq via ORAL
  Filled 2017-12-02 (×2): qty 2

## 2017-12-02 MED ORDER — METOPROLOL TARTRATE 50 MG PO TABS
50.0000 mg | ORAL_TABLET | Freq: Two times a day (BID) | ORAL | Status: DC
  Administered 2017-12-02 – 2017-12-03 (×2): 50 mg via ORAL
  Filled 2017-12-02 (×2): qty 1

## 2017-12-02 MED ORDER — AMLODIPINE BESYLATE 5 MG PO TABS
5.0000 mg | ORAL_TABLET | Freq: Every day | ORAL | Status: DC
  Administered 2017-12-03: 5 mg via ORAL
  Filled 2017-12-02: qty 1

## 2017-12-02 MED ORDER — ASPIRIN EC 81 MG PO TBEC
81.0000 mg | DELAYED_RELEASE_TABLET | Freq: Every day | ORAL | Status: DC
  Administered 2017-12-03: 81 mg via ORAL
  Filled 2017-12-02: qty 1

## 2017-12-02 MED ORDER — LOSARTAN POTASSIUM 50 MG PO TABS: 100.0000 mg | ORAL_TABLET | Freq: Every day | ORAL | Status: DC

## 2017-12-02 NOTE — Progress Notes (Signed)
  Speech Language Pathology Treatment: Cognitive-Linquistic  Patient Details Name: [name] MRN: [id number] DOB: 04-Oct-1919 Today's Date: 12/02/2017 Time: 1430-1500 SLP Time Calculation (min) (ACUTE ONLY): 30 min  Assessment / Plan / Recommendation Clinical Impression  Pt was agitated, confused last night and slept very little.  Sitting in recliner, engaging in social language (hello, how are you), smiling.  Able to follow command to make fist independently; required visual modeling and verbal cues for additional simple, concrete one-step commands.  Perseveratory output is increased in frequency today; unable to shift response pattern. Poor comprehension c/b significant disassociation of words from their meaning.  Did not engage in serial counting or singing today despite cues.    D/W daughters the nature of their mother's aphasia, including her use of "no" and refusals as a means of controlling her environment, given that she has so few words at her disposal, particularly words that convey meaning and garner a reaction.  Her aphasia is also compounded by her lack of sleep, the ICU environment, the constant auditory bombardment. We discussed use of simple language, slower rate of speech, turning off tv, allowing sleep when able, refraining from over-explaining.  They verbalize understanding.  Will follow for aphasia and dysphagia pending D/C to CIR.   HPI HPI:  84 y.o. female, resident [facility] living, with PMH of CVA 2 years ago, HTN, PAF on ASA presented with forced left gaze and aphasia but no focal weakness. CT no bleeding; tPA given.  MRI: severe chronic microvascular ischemic changes seen involving the periventricular and deep white matter of both cerebral hemispheres, left MCA infarct impacting left frontal, temporal, and occipital regions. Large HH per daughter; pt fearful of eating PTA, eats slowly, coughs with meals at baseline.        SLP Plan  Continue with current  plan of care                          Plan: Continue with current plan of care       [place]. [name], [place] CCC/SLP Acute Rehabilitation Services Office number [phone] Pager [phone]   [name] 12/02/2017, 3:10 PM

## 2017-12-02 NOTE — Progress Notes (Signed)
Physical Therapy Treatment Patient Details Name: [name] MRN: [id number] DOB: 08/14/1919 Today's Date: 12/02/2017    History of Present Illness  84 y.o. female, resident [place] living, with PMH of CVA 2 years ago, HTN, PAF on ASA presented with forced left gaze and aphasia but no focal weakness. CT no bleeding; tPA given.     PT Comments    Limited mobility today.  Pt reporting pain and refusing to try ambulation with the RW.   Follow Up Recommendations  CIR     Equipment Recommendations  None recommended by PT    Recommendations for Other Services Rehab consult     Precautions / Restrictions Precautions Precautions: Fall    Mobility  Bed Mobility Overal bed mobility: Needs Assistance Bed Mobility: Supine to Sit     Supine to sit: Mod assist     General bed mobility comments: up via left elbow and scooting assist to EOB  Transfers Overall transfer level: Needs assistance Equipment used: Rolling walker (2 wheeled) Transfers: Sit to/from [facility] Sit to Stand: Mod assist Stand pivot transfers: Mod assist;+2 safety/equipment       General transfer comment: multimodal cues for hand placement, direction and assist to come forward and up.  Ambulation/Gait             General Gait Details: attempted to direct pt to ambulate in the room, pt stated "NO" and pivoted around to the recliner as the final word.   Stairs             Wheelchair Mobility    Modified Rankin (Stroke Patients Only) Modified Rankin (Stroke Patients Only) Pre-Morbid Rankin Score: No symptoms Modified Rankin: Moderately severe disability     Balance Overall balance assessment: Needs assistance Sitting-balance support: No upper extremity supported;Single extremity supported Sitting balance-Leahy Scale: Poor Sitting balance - Comments: worked at EOB on upright posture, limited somewhat by back and R shoulder pain.   Standing balance  support: Bilateral upper extremity supported Standing balance-Leahy Scale: Poor Standing balance comment: reliant on the RW                            Cognition Arousal/Alertness: Awake/alert Behavior During Therapy: Anxious;Flat affect;WFL for tasks assessed/performed Overall Cognitive Status: Impaired/Different from baseline Area of Impairment: Attention;Following commands;Safety/judgement;Awareness;Problem solving                   Current Attention Level: Sustained   Following Commands: Follows one step commands inconsistently;Follows one step commands with increased time Safety/Judgement: Decreased awareness of safety;Decreased awareness of deficits Awareness: Intellectual Problem Solving: Slow processing;Requires verbal cues;Requires tactile cues        Exercises      General Comments General comments (skin integrity, edema, etc.): pt's daughter and therapist unable to encourage pt to do more than a simple transfer to the chair.      Pertinent Vitals/Pain Pain Assessment: Faces Faces Pain Scale: Hurts even more Pain Location: generalized Pain Descriptors / Indicators: Grimacing;Guarding Pain Intervention(s): Monitored during session    Home Living                      Prior Function            PT Goals (current goals can now be found in the care plan section) Acute Rehab PT Goals Patient Stated Goal: Return to PLOF PT Goal Formulation: With patient Time For Goal Achievement: 12/15/17  Potential to Achieve Goals: Good Progress towards PT goals: Progressing toward goals    Frequency    Min 3X/week      PT Plan Current plan remains appropriate    Co-evaluation              AM-PAC PT "6 Clicks" Daily Activity  Outcome Measure  Difficulty turning over in bed (including adjusting bedclothes, sheets and blankets)?: Unable Difficulty moving from lying on back to sitting on the side of the bed? : Unable Difficulty sitting  down on and standing up from a chair with arms (e.g., wheelchair, bedside commode, etc,.)?: Unable Help needed moving to and from a bed to chair (including a wheelchair)?: A Lot Help needed walking in hospital room?: A Lot Help needed climbing 3-5 steps with a railing? : Total 6 Click Score: 8    End of Session   Activity Tolerance: Patient tolerated treatment well Patient left: in chair;with call bell/phone within reach;with chair alarm set;with family/visitor present Nurse Communication: Mobility status PT Visit Diagnosis: Unsteadiness on feet (R26.81);Hemiplegia and hemiparesis Hemiplegia - Right/Left: Right Hemiplegia - dominant/non-dominant: Dominant Hemiplegia - caused by: Cerebral infarction     Time: 0539-7673 PT Time Calculation (min) (ACUTE ONLY): 22 min  Charges:  $Therapeutic Activity: 8-22 mins                     12/02/2017  Donnella Sham, PT Acute Rehabilitation Services [phone]  (pager) [phone]  (office)   Tessie Fass Lilliann Rossetti 12/02/2017, 2:23 PM

## 2017-12-02 NOTE — Progress Notes (Signed)
Inpatient Rehabilitation-Admissions Coordinator    Met with patient and her daughter at the bedside to discuss team's recommendation for inpatient rehabilitation. Shared booklets, expectations while in CIR, expected length of stay, and anticipated functional level at [place] has decided on plan for 24/7 hired assistance and return to ILF if able after CIR.  Family would like to pursue CIR placement when able. AC will follow along for medical readiness, therapy tolerance, and bed availability.   Jhonnie Garner, OTR/L  Rehab Admissions Coordinator  [phone] 12/02/2017 1:26 PM

## 2017-12-02 NOTE — Progress Notes (Signed)
STROKE TEAM PROGRESS NOTE   SUBJECTIVE (INTERVAL HISTORY) Her daughter is at the bedside.  Overall she feels her condition is stable. Her gaze is improving, able to have brief right gaze now. Still has afib RVR on tele. Passed swallow. Will put on metoprolol. BP high, increase BP meds. Low K, will give supplement.    OBJECTIVE Temp:  [97.4 F (36.3 C)-97.8 F (36.6 C)] 97.4 F (36.3 C) (09/11 0400) Pulse Rate:  [31-150] 64 (09/11 1100) Cardiac Rhythm: Atrial fibrillation (09/11 0800) Resp:  [14-36] 23 (09/11 1100) BP: (84-222)/(61-176) 173/98 (09/11 1100) SpO2:  [76 %-100 %] 100 % (09/11 1100)  Recent Labs  Lab 11/30/17 1658  GLUCAP 104*   Recent Labs  Lab 11/30/17 1706 11/30/17 2031 12/01/17 0321 12/02/17 0324  NA 126* 128* 131* 131*  K 3.5 3.7 3.3* 2.8*  CL 91* 90* 93* 91*  CO2  --  25 25 23   GLUCOSE 105* 97 99 133*  BUN 26* 24* 17 11  CREATININE 0.90 1.02* 0.83 0.94  CALCIUM  --  8.7* 8.3* 8.6*   Recent Labs  Lab 11/30/17 2031  AST 30  ALT 14  ALKPHOS 76  BILITOT 1.2  PROT 6.5  ALBUMIN 3.4*   Recent Labs  Lab 11/30/17 1706 11/30/17 2031 12/01/17 0321 12/02/17 0324  WBC  --  11.3* 11.5* 13.5*  NEUTROABS  --  8.1*  --   --   HGB 13.9 13.3 13.1 14.2  HCT 41.0 39.2 38.3 41.1  MCV  --  94.5 92.3 92.4  PLT  --  328 295 344   No results for input(s): CKTOTAL, CKMB, CKMBINDEX, TROPONINI in the last 168 hours. Recent Labs    11/30/17 2031  LABPROT 13.8  INR 1.07   Recent Labs    11/30/17 1937  COLORURINE YELLOW  LABSPEC 1.036*  PHURINE 5.0  GLUCOSEU NEGATIVE  HGBUR SMALL*  BILIRUBINUR NEGATIVE  KETONESUR NEGATIVE  PROTEINUR 30*  NITRITE NEGATIVE  LEUKOCYTESUR MODERATE*       Component Value Date/Time   CHOL 178 12/01/2017 0321   TRIG 94 12/01/2017 0321   HDL 64 12/01/2017 0321   CHOLHDL 2.8 12/01/2017 0321   VLDL 19 12/01/2017 0321   LDLCALC 95 12/01/2017 0321   Lab Results  Component Value Date   HGBA1C 5.6 12/01/2017       Component Value Date/Time   LABOPIA NONE DETECTED 11/30/2017 1937   COCAINSCRNUR NONE DETECTED 11/30/2017 1937   LABBENZ NONE DETECTED 11/30/2017 1937   AMPHETMU NONE DETECTED 11/30/2017 1937   THCU NONE DETECTED 11/30/2017 1937   LABBARB NONE DETECTED 11/30/2017 1937    Recent Labs  Lab 11/30/17 2031  ETH <10    I have personally reviewed the radiological images below and agree with the radiology interpretations.  Ct Angio Head W Or Wo Contrast  Result Date: 11/30/2017 CLINICAL DATA:  Initial evaluation for acute stroke, left-sided gaze, aphasia. EXAM: CT ANGIOGRAPHY HEAD AND NECK TECHNIQUE: Multidetector CT imaging of the head and neck was performed using the standard protocol during bolus administration of intravenous contrast. Multiplanar CT image reconstructions and MIPs were obtained to evaluate the vascular anatomy. Carotid stenosis measurements (when applicable) are obtained utilizing NASCET criteria, using the distal internal carotid diameter as the denominator. CONTRAST:  36mL ISOVUE-370 IOPAMIDOL (ISOVUE-370) INJECTION 76% COMPARISON:  Prior noncontrast head CT from earlier the same day. FINDINGS: CTA NECK FINDINGS Aortic arch: Visualized aortic arch of normal caliber with normal branch pattern. Extensive atheromatous plaque throughout the visualized  arch and about the origin of the great vessels without hemodynamically significant stenosis. Partially visualized subclavian arteries patent bilaterally. Right carotid system: Right common carotid artery tortuous proximally but patent to the bifurcation without stenosis. Scattered calcified plaque about the right bifurcation with associated stenosis of up to 40% by NASCET criteria. Right ICA widely patent distally to the skull base without stenosis, dissection, or occlusion. Left carotid system: Left common carotid artery tortuous proximally but widely patent to the bifurcation without stenosis. Calcified eccentric plaque about the left  bifurcation without hemodynamically significant stenosis. Left ICA patent distally to the skull base without stenosis, dissection, or occlusion. Vertebral arteries: Vertebral arteries appear to arise from the subclavian arteries bilaterally. Right vertebral artery dominant, with a diffusely hypoplastic left vertebral artery. Left vertebral artery essentially occluded at its origin, with minimal scant flow within the neck. There is some distal reconstitution at the distal left V2/V3 segment, likely via muscular branches. Atheromatous plaque at the origin of the right vertebral artery with associated moderate approximate 50% stenosis. Right vert towards a sponge otherwise patent distally within the neck. Skeleton: No acute osseous abnormality. No worrisome lytic or blastic osseous lesions. C3-4 and C5-6 vertebral bodies are ankylosed. Other neck: No acute soft tissue abnormality within the neck. Small sialolith noted within the left submandibular gland. No adenopathy. Upper chest: Visualized upper chest demonstrates no acute abnormality. Partially visualized lungs are clear. Review of the MIP images confirms the above findings CTA HEAD FINDINGS Anterior circulation: Petrous segments patent bilaterally. Smooth atheromatous plaque within the cavernous/supraclinoid ICAs without significant stenosis. ICA termini widely patent. Patent right A1 segment. Hypoplastic and/or absent left A1, accounting for the diminutive left ICA is compared to the right. Patent anterior communicating artery. Anterior cerebral arteries patent to their distal aspects without stenosis. Left M1 patent without stenosis. No proximal left M2 occlusion. Possible subtle distal small vessel occlusion at the superior aspect of the left sylvian fissure (series 7, image 79) Focal stenosis at the proximal right M1 segment with moderate diffuse narrowing distally. Normal right MCA bifurcation. No proximal right M2 occlusion. Distal right MCA branches well  perfused. Distal small vessel atheromatous irregularity. Posterior circulation: Right vertebral artery dominant. Atheromatous plaque within the right V4 segment with associated moderate stenosis. Right for PICA patent. Attenuated flow within the distal left vertebral artery which is grossly patent to the takeoff of the left PICA. Left PICA grossly opacified proximally. Left vertebral artery essentially occluded beyond the left PICA. Basilar artery diminutive and mildly irregular but patent to its distal aspect without stenosis. Superior cerebral arteries patent proximally. Predominant fetal type origin of the PCAs supplied via a prominent posterior communicating arteries. PCAs patent to their distal aspects. Venous sinuses: Patent. Anatomic variants: Predominant fetal type origin of the PCAs. Delayed phase: Not performed. Review of the MIP images confirms the above findings IMPRESSION: 1. Negative CTA for large vessel occlusion. Possible distal branch/small-vessel left MCA occlusion at the superior aspect of the left sylvian fissure as above. 2. Atheromatous plaque about the carotid bifurcations bilaterally, associated 40% stenosis on the right. No significant narrowing at the left bifurcation. 3. Approximate 50% atheromatous stenosis at the origin of the dominant right vertebral artery. Left vertebral artery largely occluded within the neck, with some distal reconstitution prior to the skull base. Vertebrobasilar system overall diminutive with fetal type origin of the PCAs bilaterally. 4. Additional mild-to-moderate age-related atheromatous disease as above. Results were discussed with Dr. Erlinda Hong, at 5:45 p.m. on 11/30/2017. Electronically Signed  By: Jeannine Boga M.D.   On: 11/30/2017 19:00   Ct Angio Neck W Or Wo Contrast  Result Date: 11/30/2017 CLINICAL DATA:  Initial evaluation for acute stroke, left-sided gaze, aphasia. EXAM: CT ANGIOGRAPHY HEAD AND NECK TECHNIQUE: Multidetector CT imaging of the head  and neck was performed using the standard protocol during bolus administration of intravenous contrast. Multiplanar CT image reconstructions and MIPs were obtained to evaluate the vascular anatomy. Carotid stenosis measurements (when applicable) are obtained utilizing NASCET criteria, using the distal internal carotid diameter as the denominator. CONTRAST:  86mL ISOVUE-370 IOPAMIDOL (ISOVUE-370) INJECTION 76% COMPARISON:  Prior noncontrast head CT from earlier the same day. FINDINGS: CTA NECK FINDINGS Aortic arch: Visualized aortic arch of normal caliber with normal branch pattern. Extensive atheromatous plaque throughout the visualized arch and about the origin of the great vessels without hemodynamically significant stenosis. Partially visualized subclavian arteries patent bilaterally. Right carotid system: Right common carotid artery tortuous proximally but patent to the bifurcation without stenosis. Scattered calcified plaque about the right bifurcation with associated stenosis of up to 40% by NASCET criteria. Right ICA widely patent distally to the skull base without stenosis, dissection, or occlusion. Left carotid system: Left common carotid artery tortuous proximally but widely patent to the bifurcation without stenosis. Calcified eccentric plaque about the left bifurcation without hemodynamically significant stenosis. Left ICA patent distally to the skull base without stenosis, dissection, or occlusion. Vertebral arteries: Vertebral arteries appear to arise from the subclavian arteries bilaterally. Right vertebral artery dominant, with a diffusely hypoplastic left vertebral artery. Left vertebral artery essentially occluded at its origin, with minimal scant flow within the neck. There is some distal reconstitution at the distal left V2/V3 segment, likely via muscular branches. Atheromatous plaque at the origin of the right vertebral artery with associated moderate approximate 50% stenosis. Right vert towards  a sponge otherwise patent distally within the neck. Skeleton: No acute osseous abnormality. No worrisome lytic or blastic osseous lesions. C3-4 and C5-6 vertebral bodies are ankylosed. Other neck: No acute soft tissue abnormality within the neck. Small sialolith noted within the left submandibular gland. No adenopathy. Upper chest: Visualized upper chest demonstrates no acute abnormality. Partially visualized lungs are clear. Review of the MIP images confirms the above findings CTA HEAD FINDINGS Anterior circulation: Petrous segments patent bilaterally. Smooth atheromatous plaque within the cavernous/supraclinoid ICAs without significant stenosis. ICA termini widely patent. Patent right A1 segment. Hypoplastic and/or absent left A1, accounting for the diminutive left ICA is compared to the right. Patent anterior communicating artery. Anterior cerebral arteries patent to their distal aspects without stenosis. Left M1 patent without stenosis. No proximal left M2 occlusion. Possible subtle distal small vessel occlusion at the superior aspect of the left sylvian fissure (series 7, image 79) Focal stenosis at the proximal right M1 segment with moderate diffuse narrowing distally. Normal right MCA bifurcation. No proximal right M2 occlusion. Distal right MCA branches well perfused. Distal small vessel atheromatous irregularity. Posterior circulation: Right vertebral artery dominant. Atheromatous plaque within the right V4 segment with associated moderate stenosis. Right for PICA patent. Attenuated flow within the distal left vertebral artery which is grossly patent to the takeoff of the left PICA. Left PICA grossly opacified proximally. Left vertebral artery essentially occluded beyond the left PICA. Basilar artery diminutive and mildly irregular but patent to its distal aspect without stenosis. Superior cerebral arteries patent proximally. Predominant fetal type origin of the PCAs supplied via a prominent posterior  communicating arteries. PCAs patent to their distal aspects. Venous sinuses:  Patent. Anatomic variants: Predominant fetal type origin of the PCAs. Delayed phase: Not performed. Review of the MIP images confirms the above findings IMPRESSION: 1. Negative CTA for large vessel occlusion. Possible distal branch/small-vessel left MCA occlusion at the superior aspect of the left sylvian fissure as above. 2. Atheromatous plaque about the carotid bifurcations bilaterally, associated 40% stenosis on the right. No significant narrowing at the left bifurcation. 3. Approximate 50% atheromatous stenosis at the origin of the dominant right vertebral artery. Left vertebral artery largely occluded within the neck, with some distal reconstitution prior to the skull base. Vertebrobasilar system overall diminutive with fetal type origin of the PCAs bilaterally. 4. Additional mild-to-moderate age-related atheromatous disease as above. Results were discussed with Dr. Erlinda Hong, at 5:45 p.m. on 11/30/2017. Electronically Signed   By: Jeannine Boga M.D.   On: 11/30/2017 19:00   Mr Brain Wo Contrast  Result Date: 12/01/2017 CLINICAL DATA:  Follow-up examination for acute stroke. EXAM: MRI HEAD WITHOUT CONTRAST TECHNIQUE: Multiplanar, multiecho pulse sequences of the brain and surrounding structures were obtained without intravenous contrast. COMPARISON:  Prior CTA from 11/30/2017 FINDINGS: Brain: Examination mildly degraded by motion artifact. Generalized diffuse prominence of the CSF containing spaces compatible with generalized age-related cerebral atrophy. Severe chronic microvascular ischemic changes seen involving the periventricular and deep white matter of both cerebral hemispheres. Chronic microvascular ischemic changes present within the pons. Superimposed remote lacunar infarcts seen involving the bilateral basal ganglia/corona radiata. Scatter remote left cerebellar infarcts noted. Encephalomalacia with volume loss at the  anterior right frontal lobe also likely related to remote ischemia. Few areas of patchy restricted diffusion seen involving the cortical gray matter and underlying subcortical white matter of the left frontal and temporal occipital regions, consistent with acute left MCA territory infarcts (series 5, image 64, 56). Extension into the underlying left periatrial white matter. Associated petechial hemorrhage without evidence for hemorrhagic transformation (series 9, image 23). No significant mass effect. No other evidence for acute or subacute ischemia. Gray-white matter differentiation otherwise maintained. Few scattered chronic micro hemorrhages noted within the cerebellum, brainstem, and deep gray nuclei, most likely related to chronic underlying hypertension. No mass lesion, midline shift or mass effect. No hydrocephalus. No extra-axial fluid collection. Normal pituitary gland. Vascular: Major intracranial vascular flow voids are maintained. Skull and upper cervical spine: Craniocervical junction normal. Grade 1 anterolisthesis of C2 on C3 with associated mild spinal stenosis noted at the upper cervical spine. Bone marrow signal intensity within normal limits. No scalp soft tissue abnormality. Sinuses/Orbits: Small fluid level noted layering within the posterior left globe, indeterminate (series 7, image 10). Patient status post ocular lens replacement bilaterally. Paranasal sinuses are clear. Trace left mastoid effusion, of doubtful significance. Other: None. IMPRESSION: 1. Patchy multifocal cortical and subcortical posterior left MCA territory infarcts as above. Associated petechial hemorrhage without frank hemorrhagic transformation. No associated mass effect. 2. Underlying age-related cerebral atrophy with severe chronic microvascular ischemic disease with chronic left cerebellar infarcts. 3. Small fluid level layering within the posterior left globe, indeterminate. Correlation with physical exam recommended.  Electronically Signed   By: Jeannine Boga M.D.   On: 12/01/2017 18:11   Dg Chest Port 1 View  Result Date: 12/01/2017 CLINICAL DATA:  84 year old female.  CHF.  Initial encounter. EXAM: PORTABLE CHEST 1 VIEW COMPARISON:  05/14/2015 chest x-ray. FINDINGS: Cardiomegaly. Calcified tortuous aorta. No infiltrate, congestive heart failure or pneumothorax. Prominent right shoulder joint degenerative changes. IMPRESSION: 1. Cardiomegaly. 2.  Aortic Atherosclerosis (ICD10-I70.0).  Calcified tortuous aorta 3. No  infiltrate or congestive heart failure. 4. Evaluation of retrocardiac region slightly limited although appearing similar to prior exam. Electronically Signed   By: Genia Del M.D.   On: 12/01/2017 12:20   Ct Head Code Stroke Wo Contrast  Result Date: 11/30/2017 CLINICAL DATA:  Code stroke. Initial evaluation for acute aphasia, left-sided gaze. EXAM: CT HEAD WITHOUT CONTRAST TECHNIQUE: Contiguous axial images were obtained from the base of the skull through the vertex without intravenous contrast. COMPARISON:  Prior CT from 05/14/2015. FINDINGS: Brain: Moderate cerebral atrophy. Severe chronic microvascular ischemic disease seen throughout the periventricular and deep white matter both cerebral hemispheres. Scattered remote lacunar infarcts within the bilateral basal ganglia. Remote left cerebellar infarcts noted as well. Subtle evolving hypodensity seen involving cortical gray matter at the supra ganglionic posterior left frontal lobe, M5 distribution, inferior left precentral gyrus (images 23, 22). No other definite evidence for acute evolving ischemic infarct. No acute intracranial hemorrhage. No mass lesion, midline shift or mass effect. No hydrocephalus. No extra-axial fluid collection. Vascular: No hyperdense vessel. Calcified atherosclerosis at the skull base. Skull: Scalp soft tissues and calvarium within normal limits. Sinuses/Orbits: Left-sided gaze noted. Paranasal sinuses are clear. No  mastoid effusion. Other: None. ASPECTS [facility] Stroke Program Early CT Score) - Ganglionic level infarction (caudate, lentiform nuclei, internal capsule, insula, M1-M3 cortex): 7 - Supraganglionic infarction (M4-M6 cortex): 2 Total score (0-10 with 10 being normal): 9 IMPRESSION: 1. Subtle evolving hypodensity involving the supra ganglionic posterior left frontal lobe (M5 distribution), suspicious for evolving acute ischemic left MCA territory infarct. No associated hemorrhage. 2. ASPECTS is 9. 3. Atrophy with severe chronic microvascular ischemic disease with remote left cerebellar infarcts. Critical Value/emergent results were called by telephone at the time of interpretation on 11/30/2017 at 5:25 pm to Dr. Erlinda Hong , who verbally acknowledged these results. Electronically Signed   By: Jeannine Boga M.D.   On: 11/30/2017 17:32   TTE - Left ventricle: The cavity size was normal. Wall thickness was   increased in a pattern of mild LVH. Systolic function was normal.   The estimated ejection fraction was in the range of 60% to 65%.   Wall motion was normal; there were no regional wall motion   abnormalities. - Aortic valve: There was mild to moderate regurgitation. - Mitral valve: Calcified annulus. Mildly thickened leaflets .   There was mild regurgitation. - Left atrium: The atrium was mildly dilated. - Right atrium: The atrium was mildly dilated. - Tricuspid valve: There was mild-moderate regurgitation directed   centrally. - Pulmonary arteries: PA peak pressure: 32 mm Hg (S).   PHYSICAL EXAM  Temp:  [97.4 F (36.3 C)-97.8 F (36.6 C)] 97.4 F (36.3 C) (09/11 0400) Pulse Rate:  [31-150] 64 (09/11 1100) Resp:  [14-36] 23 (09/11 1100) BP: (84-222)/(61-176) 173/98 (09/11 1100) SpO2:  [76 %-100 %] 100 % (09/11 1100)  General - Well nourished, well developed, mild agitation.  Ophthalmologic - fundi not visualized due to noncooperation.  Cardiovascular - irregularly irregular heart rate  and rhythm, tele showed afib RVR.  Neuro - awake alert, eyes open, word salad. Follows central commands with eye closing and tongue out but not peripheral commands. Left gaze preference, but able to cross midline. Blinking to visual threat bilaterally. Tongue midline. PERRL. Moving all extremities equally, 4/5 BUEs and 3+/5 BLEs. .DTR 1+ and no babinski. Sensation, coordination and gait not tested.   ASSESSMENT/PLAN Ms. Donna FORNESS is a 84 y.o. female with history of HTN, PAF on ASA admitted for  left gaze and aphasia. TPA given.    Stroke:  left MCA infarct embolic secondary to afib not on AC  Resultant aphasia and left gaze  CT likely left MCA cortical infarct  MRI confirmed left MCA moderate sized Infarct  CTA head and neck no LVO but likely small left MCA branch occlusion, 40% right ICA stenosis, 50% right VA origin stenosis and left VA hypoplastic. B/l fetal PCAs  2D Echo EF 60-65%  LDL 95  HgbA1c 5.6  SCDs for VTE prophylaxis  On diet  aspirin 81 mg daily prior to admission, now on plavix given ASA allergy. However, daughter said pt takes ASA 81 at home without problem. So plavix changed to ASA. Will need to start eliquis 2.5mg  bid in 5 days (12/07/17).   Patient will be counseled to be compliant with her antithrombotic medications  Ongoing aggressive stroke risk factor management  Therapy recommendations:  CIR  Disposition:  Pending  PAF  Know PAF since 08/2017  Had cardiology follow up  Determined not good AC candidate due to advanced age and falls  still afib with RVR  Increase metoprolol to 50mg  bid  Recommend DOACs with eliquis 2.5mg  bid in 5 days (12/07/17)  Hypertension Stable on high end  BP goal < 180  off cleviprex   On home amlodipine 2.5mg  - increase to 10mg   Add metoprolol and increase to 50mg  bid  Resume home losartan  Hyperlipidemia  Home meds:  none   LDL 95, goal < 70  Now on pravastatin 20  Continue statin at  discharge  Other Stroke Risk Factors  Advanced age  Other Active Problems  Leukocytosis 11.3->11.5->13.5  Lives independent living PTA  Hypokalemia K 3.7->3.3->2.8 - supplement  Hyponatremia Na 126->128->131  Hospital day # 2  This patient is critically ill due to stroke s/p tPA, afib RVR, HTN and at significant risk of neurological worsening, death form recurrent stroke, hemorrhagic conversion, heart failure, seizure. This patient's care requires constant monitoring of vital signs, hemodynamics, respiratory and cardiac monitoring, review of multiple databases, neurological assessment, discussion with family, other specialists and medical decision making of high complexity. I spent 35 minutes of neurocritical care time in the care of this patient. I had long discussion with daughter at bedside, updated pt current condition, treatment plan and potential prognosis. She expressed understanding and appreciation.     Donna Hawking, MD PhD Stroke Neurology 12/02/2017 1:06 PM    To contact Stroke Continuity provider, please refer to [email or web address] After hours, contact General Neurology

## 2017-12-03 ENCOUNTER — Encounter (HOSPITAL_COMMUNITY): Payer: Self-pay | Admitting: *Deleted

## 2017-12-03 DIAGNOSIS — I482 Chronic atrial fibrillation: Secondary | ICD-10-CM

## 2017-12-03 LAB — CBC
HCT: 35.1 % — ABNORMAL LOW (ref 36.0–46.0)
HEMOGLOBIN: 11.9 g/dL — AB (ref 12.0–15.0)
MCH: 32 pg (ref 26.0–34.0)
MCHC: 33.9 g/dL (ref 30.0–36.0)
MCV: 94.4 fL (ref 78.0–100.0)
PLATELETS: 263 10*3/uL (ref 150–400)
RBC: 3.72 MIL/uL — ABNORMAL LOW (ref 3.87–5.11)
RDW: 13.9 % (ref 11.5–15.5)
WBC: 9.8 10*3/uL (ref 4.0–10.5)

## 2017-12-03 LAB — BASIC METABOLIC PANEL
Anion gap: 9 (ref 5–15)
BUN: 14 mg/dL (ref 8–23)
CALCIUM: 8.1 mg/dL — AB (ref 8.9–10.3)
CO2: 26 mmol/L (ref 22–32)
CREATININE: 1.06 mg/dL — AB (ref 0.44–1.00)
Chloride: 99 mmol/L (ref 98–111)
GFR, EST AFRICAN AMERICAN: 50 mL/min — AB (ref >60)
GFR, EST NON AFRICAN AMERICAN: 43 mL/min — AB (ref >60)
Glucose, Bld: 89 mg/dL (ref 70–99)
Potassium: 3.9 mmol/L (ref 3.5–5.1)
SODIUM: 134 mmol/L — AB (ref 135–145)

## 2017-12-03 MED ORDER — MORPHINE SULFATE (CONCENTRATE) 10 MG/0.5ML PO SOLN: 10.0000 mg | ORAL | Status: DC | PRN

## 2017-12-03 MED ORDER — LORAZEPAM 0.5 MG PO TABS: 0.5000 mg | ORAL_TABLET | ORAL | Status: DC | PRN

## 2017-12-03 NOTE — Progress Notes (Signed)
Physical Therapy Treatment Patient Details Name: [name] MRN: [id number] DOB: Feb 11, 1920 Today's Date: 12/03/2017    History of Present Illness  84 y.o. female, resident [facility], with PMH of CVA 2 years ago, HTN, PAF on ASA presented with forced left gaze and aphasia but no focal weakness. CT no bleeding; tPA given.     PT Comments    PT session today attempting to progress mobility. Patient lethargic but agreeable to work with PT this AM. Requires physical assist for bed mobility and transfers. Able to sit EOB with min A to supervision at times, but with preference for R lateral lean. CIR AC speaking with daughter during session regarding levels of care. Depending on level of lethargy and ability to participate with PT, patient may need higher level of care.    Follow Up Recommendations  CIR     Equipment Recommendations  None recommended by PT    Recommendations for Other Services Rehab consult     Precautions / Restrictions Precautions Precautions: Fall Restrictions Weight Bearing Restrictions: No    Mobility  Bed Mobility Overal bed mobility: Needs Assistance Bed Mobility: Supine to Sit     Supine to sit: Mod assist     General bed mobility comments: requires physical assist for coming to EOB - LE and trunk management; use of bed pad for hip righting  Transfers Overall transfer level: Needs assistance Equipment used: Rolling walker (2 wheeled) Transfers: Sit to/from [facility] Sit to Stand: Mod assist;+2 physical assistance Stand pivot transfers: Mod assist;+2 safety/equipment       General transfer comment: Mod A +2 to power up at bedside; once in standing able to initiate taking pivot steps towards recliner  Ambulation/Gait             General Gait Details: deferred for patient/therapist safety due to patient lethargy   Stairs             Wheelchair Mobility    Modified Rankin (Stroke  Patients Only) Modified Rankin (Stroke Patients Only) Pre-Morbid Rankin Score: No symptoms Modified Rankin: Moderately severe disability     Balance Overall balance assessment: Needs assistance Sitting-balance support: Single extremity supported;Feet supported Sitting balance-Leahy Scale: Poor(to fair) Sitting balance - Comments: sat EOB x 5 min; able to progress to sitting with supervision for ~30 seconds but with R lateral lean Postural control: Right lateral lean Standing balance support: Bilateral upper extremity supported Standing balance-Leahy Scale: Poor Standing balance comment: reliant on external support                            Cognition Arousal/Alertness: Lethargic Behavior During Therapy: Flat affect Overall Cognitive Status: Impaired/Different from baseline Area of Impairment: Attention;Following commands;Safety/judgement;Problem solving                   Current Attention Level: Sustained   Following Commands: Follows one step commands inconsistently;Follows one step commands with increased time Safety/Judgement: Decreased awareness of safety;Decreased awareness of deficits   Problem Solving: Slow processing;Requires verbal cues;Requires tactile cues General Comments: patient requiring multimodal cueing to perform bed mobility and transfers for sequenicng      Exercises      General Comments General comments (skin integrity, edema, etc.): patients daughter and CIR AC present discussing rehab options      Pertinent Vitals/Pain Pain Assessment: Faces Faces Pain Scale: Hurts even more Pain Location: generalized Pain Descriptors / Indicators: Grimacing;Guarding Pain Intervention(s):  Limited activity within patient's tolerance;Monitored during session;Repositioned    Home Living                      Prior Function            PT Goals (current goals can now be found in the care plan section) Acute Rehab PT Goals Patient  Stated Goal: Return to PLOF PT Goal Formulation: With patient Time For Goal Achievement: 12/15/17 Potential to Achieve Goals: Good Progress towards PT goals: Progressing toward goals    Frequency    Min 3X/week      PT Plan Current plan remains appropriate    Co-evaluation              AM-PAC PT "6 Clicks" Daily Activity  Outcome Measure  Difficulty turning over in bed (including adjusting bedclothes, sheets and blankets)?: Unable Difficulty moving from lying on back to sitting on the side of the bed? : Unable Difficulty sitting down on and standing up from a chair with arms (e.g., wheelchair, bedside commode, etc,.)?: Unable Help needed moving to and from a bed to chair (including a wheelchair)?: A Lot Help needed walking in hospital room?: A Lot Help needed climbing 3-5 steps with a railing? : Total 6 Click Score: 8    End of Session Equipment Utilized During Treatment: Gait belt Activity Tolerance: Patient tolerated treatment well Patient left: in chair;with call bell/phone within reach;with chair alarm set;with family/visitor present Nurse Communication: Mobility status PT Visit Diagnosis: Unsteadiness on feet (R26.81);Hemiplegia and hemiparesis Hemiplegia - Right/Left: Right Hemiplegia - dominant/non-dominant: Dominant Hemiplegia - caused by: Cerebral infarction     Time: 6754-4920 PT Time Calculation (min) (ACUTE ONLY): 28 min  Charges:  $Therapeutic Activity: 23-37 mins                     Lanney Gins, PT, DPT 12/03/17 11:41 AM Pager: [phone]

## 2017-12-03 NOTE — Progress Notes (Addendum)
STROKE TEAM PROGRESS NOTE   SUBJECTIVE (INTERVAL HISTORY) Patient refusing therapy. Says she wants to die. Spoke with dtr at the bedside. We discussed pt wishes. Daughter is supportive. She knows her mother is tired and does not want to keep living. She is sad as they are best friends. We discussed palliative care options - she is clear she is ready to proceed with comfort care. Will get SW to assist with d/c options - residential placement in [place] vs home hospice. Will d/c meds, tests and treatments. Pt and dtr agreeable.    OBJECTIVE Temp:  [97.5 F (36.4 C)-98.5 F (36.9 C)] 98.5 F (36.9 C) (09/12 0839) Pulse Rate:  [48-100] 79 (09/12 0839) Cardiac Rhythm: Normal sinus rhythm (09/12 0705) Resp:  [16-25] 18 (09/12 0839) BP: (115-181)/(59-117) 181/84 (09/12 0839) SpO2:  [95 %-100 %] 98 % (09/12 0839)  Recent Labs  Lab 11/30/17 1658  GLUCAP 104*   Recent Labs  Lab 11/30/17 1706  11/30/17 2031 12/01/17 0321 12/02/17 0324 12/03/17 0458  NA 126*  --  128* 131* 131* 134*  K 3.5  --  3.7 3.3* 2.8* 3.9  CL 91*  --  90* 93* 91* 99  CO2  --   --  25 25 23 26   GLUCOSE 105*  --  97 99 133* 89  BUN 26*  --  24* 17 11 14   CREATININE 0.90  --  1.02* 0.83 0.94 1.06*  CALCIUM  --    < > 8.7* 8.3* 8.6* 8.1*   < > = values in this interval not displayed.   Recent Labs  Lab 11/30/17 2031  AST 30  ALT 14  ALKPHOS 76  BILITOT 1.2  PROT 6.5  ALBUMIN 3.4*   Recent Labs  Lab 11/30/17 1706 11/30/17 2031 12/01/17 0321 12/02/17 0324 12/03/17 0458  WBC  --  11.3* 11.5* 13.5* 9.8  NEUTROABS  --  8.1*  --   --   --   HGB 13.9 13.3 13.1 14.2 11.9*  HCT 41.0 39.2 38.3 41.1 35.1*  MCV  --  94.5 92.3 92.4 94.4  PLT  --  328 295 344 263   No results for input(s): CKTOTAL, CKMB, CKMBINDEX, TROPONINI in the last 168 hours. Recent Labs    11/30/17 2031  LABPROT 13.8  INR 1.07   Recent Labs    11/30/17 1937  COLORURINE YELLOW  LABSPEC 1.036*  PHURINE 5.0  GLUCOSEU  NEGATIVE  HGBUR SMALL*  BILIRUBINUR NEGATIVE  KETONESUR NEGATIVE  PROTEINUR 30*  NITRITE NEGATIVE  LEUKOCYTESUR MODERATE*       Component Value Date/Time   CHOL 178 12/01/2017 0321   TRIG 94 12/01/2017 0321   HDL 64 12/01/2017 0321   CHOLHDL 2.8 12/01/2017 0321   VLDL 19 12/01/2017 0321   LDLCALC 95 12/01/2017 0321   Lab Results  Component Value Date   HGBA1C 5.6 12/01/2017      Component Value Date/Time   LABOPIA NONE DETECTED 11/30/2017 1937   COCAINSCRNUR NONE DETECTED 11/30/2017 1937   LABBENZ NONE DETECTED 11/30/2017 1937   AMPHETMU NONE DETECTED 11/30/2017 1937   THCU NONE DETECTED 11/30/2017 1937   LABBARB NONE DETECTED 11/30/2017 1937    Recent Labs  Lab 11/30/17 2031  ETH <10     Ct Angio Head W Or Wo Contrast  Result Date: 11/30/2017 CLINICAL DATA:  Initial evaluation for acute stroke, left-sided gaze, aphasia. EXAM: CT ANGIOGRAPHY HEAD AND NECK TECHNIQUE: Multidetector CT imaging of the head and neck was performed  using the standard protocol during bolus administration of intravenous contrast. Multiplanar CT image reconstructions and MIPs were obtained to evaluate the vascular anatomy. Carotid stenosis measurements (when applicable) are obtained utilizing NASCET criteria, using the distal internal carotid diameter as the denominator. CONTRAST:  63mL ISOVUE-370 IOPAMIDOL (ISOVUE-370) INJECTION 76% COMPARISON:  Prior noncontrast head CT from earlier the same day. FINDINGS: CTA NECK FINDINGS Aortic arch: Visualized aortic arch of normal caliber with normal branch pattern. Extensive atheromatous plaque throughout the visualized arch and about the origin of the great vessels without hemodynamically significant stenosis. Partially visualized subclavian arteries patent bilaterally. Right carotid system: Right common carotid artery tortuous proximally but patent to the bifurcation without stenosis. Scattered calcified plaque about the right bifurcation with associated  stenosis of up to 40% by NASCET criteria. Right ICA widely patent distally to the skull base without stenosis, dissection, or occlusion. Left carotid system: Left common carotid artery tortuous proximally but widely patent to the bifurcation without stenosis. Calcified eccentric plaque about the left bifurcation without hemodynamically significant stenosis. Left ICA patent distally to the skull base without stenosis, dissection, or occlusion. Vertebral arteries: Vertebral arteries appear to arise from the subclavian arteries bilaterally. Right vertebral artery dominant, with a diffusely hypoplastic left vertebral artery. Left vertebral artery essentially occluded at its origin, with minimal scant flow within the neck. There is some distal reconstitution at the distal left V2/V3 segment, likely via muscular branches. Atheromatous plaque at the origin of the right vertebral artery with associated moderate approximate 50% stenosis. Right vert towards a sponge otherwise patent distally within the neck. Skeleton: No acute osseous abnormality. No worrisome lytic or blastic osseous lesions. C3-4 and C5-6 vertebral bodies are ankylosed. Other neck: No acute soft tissue abnormality within the neck. Small sialolith noted within the left submandibular gland. No adenopathy. Upper chest: Visualized upper chest demonstrates no acute abnormality. Partially visualized lungs are clear. Review of the MIP images confirms the above findings CTA HEAD FINDINGS Anterior circulation: Petrous segments patent bilaterally. Smooth atheromatous plaque within the cavernous/supraclinoid ICAs without significant stenosis. ICA termini widely patent. Patent right A1 segment. Hypoplastic and/or absent left A1, accounting for the diminutive left ICA is compared to the right. Patent anterior communicating artery. Anterior cerebral arteries patent to their distal aspects without stenosis. Left M1 patent without stenosis. No proximal left M2 occlusion.  Possible subtle distal small vessel occlusion at the superior aspect of the left sylvian fissure (series 7, image 79) Focal stenosis at the proximal right M1 segment with moderate diffuse narrowing distally. Normal right MCA bifurcation. No proximal right M2 occlusion. Distal right MCA branches well perfused. Distal small vessel atheromatous irregularity. Posterior circulation: Right vertebral artery dominant. Atheromatous plaque within the right V4 segment with associated moderate stenosis. Right for PICA patent. Attenuated flow within the distal left vertebral artery which is grossly patent to the takeoff of the left PICA. Left PICA grossly opacified proximally. Left vertebral artery essentially occluded beyond the left PICA. Basilar artery diminutive and mildly irregular but patent to its distal aspect without stenosis. Superior cerebral arteries patent proximally. Predominant fetal type origin of the PCAs supplied via a prominent posterior communicating arteries. PCAs patent to their distal aspects. Venous sinuses: Patent. Anatomic variants: Predominant fetal type origin of the PCAs. Delayed phase: Not performed. Review of the MIP images confirms the above findings IMPRESSION: 1. Negative CTA for large vessel occlusion. Possible distal branch/small-vessel left MCA occlusion at the superior aspect of the left sylvian fissure as above. 2. Atheromatous plaque about  the carotid bifurcations bilaterally, associated 40% stenosis on the right. No significant narrowing at the left bifurcation. 3. Approximate 50% atheromatous stenosis at the origin of the dominant right vertebral artery. Left vertebral artery largely occluded within the neck, with some distal reconstitution prior to the skull base. Vertebrobasilar system overall diminutive with fetal type origin of the PCAs bilaterally. 4. Additional mild-to-moderate age-related atheromatous disease as above. Results were discussed with Dr. Erlinda Hong, at 5:45 p.m. on  11/30/2017. Electronically Signed   By: Jeannine Boga M.D.   On: 11/30/2017 19:00   Ct Angio Neck W Or Wo Contrast  Result Date: 11/30/2017 CLINICAL DATA:  Initial evaluation for acute stroke, left-sided gaze, aphasia. EXAM: CT ANGIOGRAPHY HEAD AND NECK TECHNIQUE: Multidetector CT imaging of the head and neck was performed using the standard protocol during bolus administration of intravenous contrast. Multiplanar CT image reconstructions and MIPs were obtained to evaluate the vascular anatomy. Carotid stenosis measurements (when applicable) are obtained utilizing NASCET criteria, using the distal internal carotid diameter as the denominator. CONTRAST:  46mL ISOVUE-370 IOPAMIDOL (ISOVUE-370) INJECTION 76% COMPARISON:  Prior noncontrast head CT from earlier the same day. FINDINGS: CTA NECK FINDINGS Aortic arch: Visualized aortic arch of normal caliber with normal branch pattern. Extensive atheromatous plaque throughout the visualized arch and about the origin of the great vessels without hemodynamically significant stenosis. Partially visualized subclavian arteries patent bilaterally. Right carotid system: Right common carotid artery tortuous proximally but patent to the bifurcation without stenosis. Scattered calcified plaque about the right bifurcation with associated stenosis of up to 40% by NASCET criteria. Right ICA widely patent distally to the skull base without stenosis, dissection, or occlusion. Left carotid system: Left common carotid artery tortuous proximally but widely patent to the bifurcation without stenosis. Calcified eccentric plaque about the left bifurcation without hemodynamically significant stenosis. Left ICA patent distally to the skull base without stenosis, dissection, or occlusion. Vertebral arteries: Vertebral arteries appear to arise from the subclavian arteries bilaterally. Right vertebral artery dominant, with a diffusely hypoplastic left vertebral artery. Left vertebral  artery essentially occluded at its origin, with minimal scant flow within the neck. There is some distal reconstitution at the distal left V2/V3 segment, likely via muscular branches. Atheromatous plaque at the origin of the right vertebral artery with associated moderate approximate 50% stenosis. Right vert towards a sponge otherwise patent distally within the neck. Skeleton: No acute osseous abnormality. No worrisome lytic or blastic osseous lesions. C3-4 and C5-6 vertebral bodies are ankylosed. Other neck: No acute soft tissue abnormality within the neck. Small sialolith noted within the left submandibular gland. No adenopathy. Upper chest: Visualized upper chest demonstrates no acute abnormality. Partially visualized lungs are clear. Review of the MIP images confirms the above findings CTA HEAD FINDINGS Anterior circulation: Petrous segments patent bilaterally. Smooth atheromatous plaque within the cavernous/supraclinoid ICAs without significant stenosis. ICA termini widely patent. Patent right A1 segment. Hypoplastic and/or absent left A1, accounting for the diminutive left ICA is compared to the right. Patent anterior communicating artery. Anterior cerebral arteries patent to their distal aspects without stenosis. Left M1 patent without stenosis. No proximal left M2 occlusion. Possible subtle distal small vessel occlusion at the superior aspect of the left sylvian fissure (series 7, image 79) Focal stenosis at the proximal right M1 segment with moderate diffuse narrowing distally. Normal right MCA bifurcation. No proximal right M2 occlusion. Distal right MCA branches well perfused. Distal small vessel atheromatous irregularity. Posterior circulation: Right vertebral artery dominant. Atheromatous plaque within the right V4 segment  with associated moderate stenosis. Right for PICA patent. Attenuated flow within the distal left vertebral artery which is grossly patent to the takeoff of the left PICA. Left PICA  grossly opacified proximally. Left vertebral artery essentially occluded beyond the left PICA. Basilar artery diminutive and mildly irregular but patent to its distal aspect without stenosis. Superior cerebral arteries patent proximally. Predominant fetal type origin of the PCAs supplied via a prominent posterior communicating arteries. PCAs patent to their distal aspects. Venous sinuses: Patent. Anatomic variants: Predominant fetal type origin of the PCAs. Delayed phase: Not performed. Review of the MIP images confirms the above findings IMPRESSION: 1. Negative CTA for large vessel occlusion. Possible distal branch/small-vessel left MCA occlusion at the superior aspect of the left sylvian fissure as above. 2. Atheromatous plaque about the carotid bifurcations bilaterally, associated 40% stenosis on the right. No significant narrowing at the left bifurcation. 3. Approximate 50% atheromatous stenosis at the origin of the dominant right vertebral artery. Left vertebral artery largely occluded within the neck, with some distal reconstitution prior to the skull base. Vertebrobasilar system overall diminutive with fetal type origin of the PCAs bilaterally. 4. Additional mild-to-moderate age-related atheromatous disease as above. Results were discussed with Dr. Erlinda Hong, at 5:45 p.m. on 11/30/2017. Electronically Signed   By: Jeannine Boga M.D.   On: 11/30/2017 19:00   Mr Brain Wo Contrast  Result Date: 12/01/2017 CLINICAL DATA:  Follow-up examination for acute stroke. EXAM: MRI HEAD WITHOUT CONTRAST TECHNIQUE: Multiplanar, multiecho pulse sequences of the brain and surrounding structures were obtained without intravenous contrast. COMPARISON:  Prior CTA from 11/30/2017 FINDINGS: Brain: Examination mildly degraded by motion artifact. Generalized diffuse prominence of the CSF containing spaces compatible with generalized age-related cerebral atrophy. Severe chronic microvascular ischemic changes seen involving the  periventricular and deep white matter of both cerebral hemispheres. Chronic microvascular ischemic changes present within the pons. Superimposed remote lacunar infarcts seen involving the bilateral basal ganglia/corona radiata. Scatter remote left cerebellar infarcts noted. Encephalomalacia with volume loss at the anterior right frontal lobe also likely related to remote ischemia. Few areas of patchy restricted diffusion seen involving the cortical gray matter and underlying subcortical white matter of the left frontal and temporal occipital regions, consistent with acute left MCA territory infarcts (series 5, image 64, 56). Extension into the underlying left periatrial white matter. Associated petechial hemorrhage without evidence for hemorrhagic transformation (series 9, image 23). No significant mass effect. No other evidence for acute or subacute ischemia. Gray-white matter differentiation otherwise maintained. Few scattered chronic micro hemorrhages noted within the cerebellum, brainstem, and deep gray nuclei, most likely related to chronic underlying hypertension. No mass lesion, midline shift or mass effect. No hydrocephalus. No extra-axial fluid collection. Normal pituitary gland. Vascular: Major intracranial vascular flow voids are maintained. Skull and upper cervical spine: Craniocervical junction normal. Grade 1 anterolisthesis of C2 on C3 with associated mild spinal stenosis noted at the upper cervical spine. Bone marrow signal intensity within normal limits. No scalp soft tissue abnormality. Sinuses/Orbits: Small fluid level noted layering within the posterior left globe, indeterminate (series 7, image 10). Patient status post ocular lens replacement bilaterally. Paranasal sinuses are clear. Trace left mastoid effusion, of doubtful significance. Other: None. IMPRESSION: 1. Patchy multifocal cortical and subcortical posterior left MCA territory infarcts as above. Associated petechial hemorrhage without  frank hemorrhagic transformation. No associated mass effect. 2. Underlying age-related cerebral atrophy with severe chronic microvascular ischemic disease with chronic left cerebellar infarcts. 3. Small fluid level layering within the posterior left globe,  indeterminate. Correlation with physical exam recommended. Electronically Signed   By: Jeannine Boga M.D.   On: 12/01/2017 18:11   Dg Chest Port 1 View  Result Date: 12/01/2017 CLINICAL DATA:  84 year old female.  CHF.  Initial encounter. EXAM: PORTABLE CHEST 1 VIEW COMPARISON:  05/14/2015 chest x-ray. FINDINGS: Cardiomegaly. Calcified tortuous aorta. No infiltrate, congestive heart failure or pneumothorax. Prominent right shoulder joint degenerative changes. IMPRESSION: 1. Cardiomegaly. 2.  Aortic Atherosclerosis (ICD10-I70.0).  Calcified tortuous aorta 3. No infiltrate or congestive heart failure. 4. Evaluation of retrocardiac region slightly limited although appearing similar to prior exam. Electronically Signed   By: Genia Del M.D.   On: 12/01/2017 12:20   Ct Head Code Stroke Wo Contrast  Result Date: 11/30/2017 CLINICAL DATA:  Code stroke. Initial evaluation for acute aphasia, left-sided gaze. EXAM: CT HEAD WITHOUT CONTRAST TECHNIQUE: Contiguous axial images were obtained from the base of the skull through the vertex without intravenous contrast. COMPARISON:  Prior CT from 05/14/2015. FINDINGS: Brain: Moderate cerebral atrophy. Severe chronic microvascular ischemic disease seen throughout the periventricular and deep white matter both cerebral hemispheres. Scattered remote lacunar infarcts within the bilateral basal ganglia. Remote left cerebellar infarcts noted as well. Subtle evolving hypodensity seen involving cortical gray matter at the supra ganglionic posterior left frontal lobe, M5 distribution, inferior left precentral gyrus (images 23, 22). No other definite evidence for acute evolving ischemic infarct. No acute intracranial  hemorrhage. No mass lesion, midline shift or mass effect. No hydrocephalus. No extra-axial fluid collection. Vascular: No hyperdense vessel. Calcified atherosclerosis at the skull base. Skull: Scalp soft tissues and calvarium within normal limits. Sinuses/Orbits: Left-sided gaze noted. Paranasal sinuses are clear. No mastoid effusion. Other: None. ASPECTS [facility] Stroke Program Early CT Score) - Ganglionic level infarction (caudate, lentiform nuclei, internal capsule, insula, M1-M3 cortex): 7 - Supraganglionic infarction (M4-M6 cortex): 2 Total score (0-10 with 10 being normal): 9 IMPRESSION: 1. Subtle evolving hypodensity involving the supra ganglionic posterior left frontal lobe (M5 distribution), suspicious for evolving acute ischemic left MCA territory infarct. No associated hemorrhage. 2. ASPECTS is 9. 3. Atrophy with severe chronic microvascular ischemic disease with remote left cerebellar infarcts. Critical Value/emergent results were called by telephone at the time of interpretation on 11/30/2017 at 5:25 pm to Dr. Erlinda Hong , who verbally acknowledged these results. Electronically Signed   By: Jeannine Boga M.D.   On: 11/30/2017 17:32   TTE - Left ventricle: The cavity size was normal. Wall thickness was   increased in a pattern of mild LVH. Systolic function was normal.   The estimated ejection fraction was in the range of 60% to 65%.   Wall motion was normal; there were no regional wall motion   abnormalities. - Aortic valve: There was mild to moderate regurgitation. - Mitral valve: Calcified annulus. Mildly thickened leaflets .   There was mild regurgitation. - Left atrium: The atrium was mildly dilated. - Right atrium: The atrium was mildly dilated. - Tricuspid valve: There was mild-moderate regurgitation directed   centrally. - Pulmonary arteries: PA peak pressure: 32 mm Hg (S).   PHYSICAL EXAM General - frail elderly female with grimace on her face, mild agitation.  Cardiovascular -  irregularly irregular heart rate and rhythm, tele showed afib RVR.  Neuro - awake alert, eyes open, expressive ahasia but able to clearly state desires. shes 2 fingers to command, then perseverates. word salad. Follows central commands with eye closing and tongue. Eyes midline able to cross midline. Blinking to visual threat bilaterally. Tongue midline.  PERRL. Moving all extremities equally, 4/5 BUEs and 3+/5 BLEs. no babinski. Sensation, coordination and gait not tested.   ASSESSMENT/PLAN Ms. LAYNE LEBON is a 84 y.o. female with history of HTN, PAF on ASA admitted for left gaze and aphasia. TPA given.    Stroke:  left MCA infarct embolic secondary to afib not on AC  Resultant aphasia and left gaze  CT likely left MCA cortical infarct  MRI confirmed left MCA moderate sized Infarct  CTA head and neck no LVO but likely small left MCA branch occlusion, 40% right ICA stenosis, 50% right VA origin stenosis and left VA hypoplastic. B/l fetal PCAs  2D Echo EF 60-65%  LDL 95  HgbA1c 5.6  On diet  aspirin 81 mg daily prior to admission, now on plavix   Therapy recommendations:  CIR  Disposition:  Pending  Already DNR  Dtr and pt request comfort care. All meds and tx d/c.  SW for d/c options - residential hospice vs home / placement   PAF  Know PAF since 08/2017  Had cardiology follow up  Determined not good AC candidate due to advanced age and falls  still afib with RVR  Hypertension  Treated with cleviprex during ICU stay   Hyperlipidemia  Home meds:  none   LDL 95, goal < 70  Other Stroke Risk Factors  Advanced age  Other Active Problems  Leukocytosis 11.3->11.5->13.5->9.8  Lives independent living PTA  Hypokalemia K 3.7->3.3->2.8->3.9 supplemented  Hyponatremia Na 126->128->131->134  Hospital day # [place], MSN, APRN, ANVP-BC, AGPCNP-BC Advanced Practice Stroke Nurse [place] for Schedule & Pager  information 12/03/2017 1:33 PM  ATTENDING NOTE: I reviewed above note and agree with the assessment and plan. Pt was seen and examined.   Patient daughter at bedside.  Patient lying in bed not in distress.  However, had a conversation with patient, although patient still has mild to moderate expressive aphasia and word salad, however quite clear about her thoughts and decisions.  When asking about rehab, she said no and shook her head.  She told me "I am happy" "everyone is happy" "I want to die" "time to die" "I am 89" and then became agitated with perseveration. She has been refusing therapies and not cooperative with therapist.   PT/OT recommending CIR. Discussed with daughter and we all feel that pt awake alert and seems competent at this time to make decisions, we will honor her wishes. Will consult palliative care to consider home or residential hospice.   Rosalin Hawking, MD PhD Stroke Neurology 12/03/2017 6:27 PM     To contact Stroke Continuity provider, please refer to [email or web address] After hours, contact General Neurology

## 2017-12-03 NOTE — Progress Notes (Signed)
[facility]) RN Visit  Received request from [place] for family interest in [facility].   Chart reviewed and spoke with family to acknowledge referral.    Left message to update CSW on eligibility for [facility].   Thank you, Venia Carbon BSN, [facility] Liaison [phone]

## 2017-12-03 NOTE — Consult Note (Signed)
[facility] CM Primary Care Navigator  12/03/2017  [name] Jul 08, 1919 [id number]   Met with patient, daughter [name]) and grandson [name]) at the bedside to identify possible discharge needs.  Daughter states that patient had refused therapy- rehab with CIR [facility]); had verbalized "wanting to die" and is ready to proceed with comfort care.  Per MD note, Inpatient social worker to assist with discharge options for residential hospice placement in [facility] vs. home hospice.  [facility]) RN note, states that family had expressed interest in [facility]- residential hospice at discharge.  Noted no further [facility] care management needs identified at this point.   For additional questions please contact:  Edwena Felty A. October Peery, BSN, RN-BC [facility] PRIMARY CARE Navigator Cell: [phone]

## 2017-12-03 NOTE — Clinical Social Work Note (Signed)
Clinical Social Work Assessment  Patient Details  Name: [name] MRN: [id number] Date of Birth: 04-24-1919  Date of referral:  12/03/17               Reason for consult:  End of Life/Hospice                Permission sought to share information with:  Facility Sport and exercise psychologist, Family Supports Permission granted to share information::  Yes, Verbal Permission Granted  Name::     Health and safety inspector::  [employer]  Relationship::  Daughter   Contact Information:     Housing/Transportation Living arrangements for the past 2 months:  [place] of Information:  Medical Team, Adult Children Patient Interpreter Needed:  None Criminal Activity/Legal Involvement Pertinent to Current Situation/Hospitalization:  No - Comment as needed Significant Relationships:  Adult Children Lives with:  Self, Facility Resident Do you feel safe going back to the place where you live?  Yes Need for family participation in patient care:  Yes (Comment)  Care giving concerns:  Patient is at end of life and would like to be comfortable, plan for transition to residential hospice.   Social Worker assessment / plan:  CSW alerted by medical team that patient and daughter have elected to transition to comfort care. CSW met with patient's daughter to discuss options and answer questions. CSW sent referral to Admissions at [facility]. CSW to follow.  Employment status:  Retired Forensic scientist:  Medicare PT Recommendations:  Inpatient Reliance / Referral to community resources:     Patient/Family's Response to care:  Patient and daughter agreeable to transition to comfort care.  Patient/Family's Understanding of and Emotional Response to Diagnosis, Current Treatment, and Prognosis:  Patient would not awaken during discussion, patient's daughter discussed plan to keep the patient comfortable. Patient's daughter discussed how the patient has been trying for so  long for her and her daughter but she's been in so much pain and she's just ready to give up. Patient's daughter acknowledged that she is understanding of the decision but will be a mess when the patient is gone because she's her best friend.   Emotional Assessment Appearance:  Appears stated age Attitude/Demeanor/Rapport:  Unable to Assess Affect (typically observed):  Unable to Assess Orientation:  (unable to assess) Alcohol / Substance use:  Not Applicable Psych involvement (Current and /or in the community):  No (Comment)  Discharge Needs  Concerns to be addressed:  Care Coordination Readmission within the last 30 days:  No Current discharge risk:  Terminally ill, Dependent with Mobility, Physical Impairment Barriers to Discharge:  Continued Medical Work up   [employer], [place] 12/03/2017, 2:55 PM

## 2017-12-03 NOTE — Care Management Important Message (Signed)
Important Message  Patient Details  Name: GALEN MALKOWSKI MRN: [id number] Date of Birth: [birth year]   Medicare Important Message Given:  Yes    Kamali Nephew Montine Circle 12/03/2017, 3:38 PM

## 2017-12-03 NOTE — Progress Notes (Signed)
CSW contacted by hospice liaison that they have deemed that the patient does not qualify for [facility] at this time. CSW to update patient's daughter tomorrow and discuss other options.  CSW to follow.  Laveda Abbe,  [place] Clinical Social Worker [phone]

## 2017-12-03 NOTE — Progress Notes (Signed)
Inpatient Rehabilitation-Admissions Coordinator   Spoke with pt and family at the bedside as follow up regarding CIR. Today, pt has expressed multiple times that she does not want therapy and has stated she "wants to die." Daughter at the bedside aware of what pt is saying and says she wants to follow her wishes for no intensive therapy. AC has communicated with nursing and discussed both pt's statements this morning and that family wishes to have palliative care consult. AC followed up with CM and SW regarding new dispo needs.   AC will sign off.   Please call if questions.   Jhonnie Garner, OTR/L  Rehab Admissions Coordinator  [phone] 12/03/2017 11:44 AM

## 2017-12-04 DIAGNOSIS — I4891 Unspecified atrial fibrillation: Secondary | ICD-10-CM

## 2017-12-04 DIAGNOSIS — Z8673 Personal history of transient ischemic attack (TIA), and cerebral infarction without residual deficits: Secondary | ICD-10-CM

## 2017-12-04 MED ORDER — ACETAMINOPHEN 325 MG PO TABS: 650.0000 mg | ORAL_TABLET | ORAL | Status: AC | PRN

## 2017-12-04 NOTE — Progress Notes (Addendum)
STROKE TEAM PROGRESS NOTE   SUBJECTIVE (INTERVAL HISTORY) Patient lying in bed, awake.  Still not eating.  Other daughter at bedside today.  They have been denied by residential hospice and they are looking at other options.  Daughter from yesterday arrived during rounds and we continue to discuss discharge options.  Now working with Education officer, museum and hope to have an opinion by end of the day -> anticipate return to [place] Saturday with home hospice there with 24 hour care.   OBJECTIVE Temp:  [97.5 F (36.4 C)-98.2 F (36.8 C)] 97.9 F (36.6 C) (09/13 0834) Pulse Rate:  [72-91] 91 (09/13 0834) Cardiac Rhythm: Normal sinus rhythm (09/13 0400) Resp:  [14-18] 14 (09/13 0834) BP: (130-179)/(73-111) 175/79 (09/13 0834) SpO2:  [95 %-98 %] 97 % (09/13 0834)  Recent Labs  Lab 11/30/17 1706  11/30/17 2031 12/01/17 0321 12/02/17 0324 12/03/17 0458  NA 126*  --  128* 131* 131* 134*  K 3.5  --  3.7 3.3* 2.8* 3.9  CL 91*  --  90* 93* 91* 99  CO2  --   --  25 25 23 26   GLUCOSE 105*  --  97 99 133* 89  BUN 26*  --  24* 17 11 14   CREATININE 0.90  --  1.02* 0.83 0.94 1.06*  CALCIUM  --    < > 8.7* 8.3* 8.6* 8.1*   < > = values in this interval not displayed.    PHYSICAL EXAM General - frail elderly female with grimace on her face, mild agitation.  Cardiovascular - irregularly irregular heart rate and rhythm, tele showed afib RVR.  Neuro - awake alert, eyes open, expressive ahasia but able to clearly state desires. shes 2 fingers to command, then perseverates. word salad. Follows central commands with eye closing and tongue. Eyes midline able to cross midline. Blinking to visual threat bilaterally. Tongue midline. PERRL. Moving all extremities equally, 4/5 BUEs and 3+/5 BLEs. no babinski. Sensation, coordination and gait not tested.   ASSESSMENT/PLAN [name] is a 84 y.o. female with history of HTN, PAF on ASA admitted for left gaze and aphasia. TPA given.  While patient  tolerated TPA without difficulty, patient is tired of fighting and wants to die.  Daughters are supporting her decision.  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 B/l fetal PCAs  2D Echo EF 60-65%  LDL 95  HgbA1c 5.6  On diet  aspirin 81 mg daily prior to admission  Therapy recommendations:  CIR  Disposition: Plan return to [facility] tomorrow with home hospice and 24-hour care.  Family working on renovating room and getting appropriate equipment in place  Already DNR  Continue comfort care.   Given little to no p.o. intake and frail state, anticipate death within the next 2 weeks  PAF  Know PAF since 08/2017  Had cardiology follow up  Determined not good AC candidate due to advanced age and falls  still afib with RVR  Hypertension  Treated with cleviprex during ICU stay   Hyperlipidemia  Home meds:  none   LDL 95, goal < 70  Other Stroke Risk Factors  Advanced age  Other Active Problems  Leukocytosis, resolved 9.8  Lives independent living PTA  Hypokalemia 3.9 supplemented  Hyponatremia [facility] day # Spry, MSN, APRN, ANVP-BC, AGPCNP-BC Advanced Practice Stroke Nurse Nina for Schedule & Pager information 12/04/2017 8:44 AM  ATTENDING NOTE: I reviewed above note and agree with the assessment and plan. Pt was seen and examined.   Pt neuro stable, no acute event overnight. Residential hospice declined pt admission, will aim for home hospice. Pt is able to eat and drink when RN fed her. She is on comfort care measures. Plan to d/c tomorrow to home hospice.   Rosalin Hawking, MD  PhD Stroke Neurology 12/04/2017 10:21 PM     To contact Stroke Continuity provider, please refer to [email or web address] After hours, contact General Neurology

## 2017-12-04 NOTE — Discharge Summary (Addendum)
Stroke Discharge Summary  Patient ID: su duma   MRN: [id number]      DOB: 08-Jul-1919  Date of Admission: 11/30/2017 Date of Discharge: 12/04/2017  Attending Physician:  Rosalin Hawking, MD, Stroke MD Consultant(s):     Donna Simons, MD (Physical Medicine & Rehabtilitation) Patient's PCP:  Donna Seashore, MD  DISCHARGE DIAGNOSIS:  Principal Problem:   Stroke (cerebrum) [facility]) Active Problems:   Hypertension   History of CVA (cerebrovascular accident)   Atrial fibrillation [facility])   Past Medical History:  Diagnosis Date  . Acid reflux   . Cervical dysplasia   . Cystocele   . Heart valve disorder    "leaking"  . Hypertension   . Osteoporosis   . Rectocele   . Stroke [facility])    mild strokes  . Urinary incontinence    Past Surgical History:  Procedure Laterality Date  . APPENDECTOMY    . BLADDER SUSPENSION    . CHOLECYSTECTOMY  2003  . COLPOSCOPY    . INGUINAL HERNIA REPAIR    . PROLAPSED INTESTINES    . SURGERY FOR ADHESIONS RELATED TO INTESTINES    . TOTAL ABDOMINAL HYSTERECTOMY  1991   TAH, A/P REPAIR  . TOTAL HIP ARTHROPLASTY     X2    Allergies as of 12/04/2017      Reactions   Penicillins Anaphylaxis   Has patient had a PCN reaction causing immediate rash, facial/tongue/throat swelling, SOB or lightheadedness with hypotension: Yes Has patient had a PCN reaction causing severe rash involving mucus membranes or skin necrosis: Unknown Has patient had a PCN reaction that required hospitalization: No Has patient had a PCN reaction occurring within the last 10 years: No If all of the above answers are "NO", then may proceed with Cephalosporin use.   Quinine Derivatives Other (See Comments)   unknown   Aspirin Rash   "full strength"      Medication List    STOP taking these medications   amLODipine 2.5 MG tablet Commonly known as:  NORVASC   aspirin 81 MG tablet   ENSURE PLUS Liqd   feeding supplement Liqd   losartan 100 MG tablet Commonly  known as:  COZAAR   omeprazole 20 MG capsule Commonly known as:  PRILOSEC   pramoxine 1 % foam Commonly known as:  PROCTOFOAM   REFRESH OP   trimethoprim 100 MG tablet Commonly known as:  TRIMPEX     TAKE these medications   acetaminophen 325 MG tablet Commonly known as:  TYLENOL Take 2 tablets (650 mg total) by mouth every 4 (four) hours as needed for mild pain (or temp > 37.5 C (99.5 F)). What changed:    medication strength  how much to take  when to take this  reasons to take this  additional instructions       LABORATORY STUDIES CBC:  Recent Labs  Lab 11/30/17 2031  12/02/17 0324 12/03/17 0458  WBC 11.3*   < > 13.5* 9.8  NEUTROABS 8.1*  --   --   --   HGB 13.3   < > 14.2 11.9*  HCT 39.2   < > 41.1 35.1*  MCV 94.5   < > 92.4 94.4  PLT 328   < > 344 263   < > = values in this interval not displayed.   CMP    Component Value Date/Time   NA 134 (L) 12/03/2017 0458   K 3.9 12/03/2017 0458   CL 99 12/03/2017  0458   CO2 26 12/03/2017 0458   GLUCOSE 89 12/03/2017 0458   BUN 14 12/03/2017 0458   CREATININE 1.06 (H) 12/03/2017 0458   CALCIUM 8.1 (L) 12/03/2017 0458   PROT 6.5 11/30/2017 2031   ALBUMIN 3.4 (L) 11/30/2017 2031   AST 30 11/30/2017 2031   ALT 14 11/30/2017 2031   ALKPHOS 76 11/30/2017 2031   BILITOT 1.2 11/30/2017 2031   GFRNONAA 43 (L) 12/03/2017 0458   GFRAA 50 (L) 12/03/2017 0458   COAGS Lab Results  Component Value Date   INR 1.07 11/30/2017   Lipid Panel    Component Value Date/Time   CHOL 178 12/01/2017 0321   TRIG 94 12/01/2017 0321   HDL 64 12/01/2017 0321   CHOLHDL 2.8 12/01/2017 0321   VLDL 19 12/01/2017 0321   LDLCALC 95 12/01/2017 0321   HgbA1C  Lab Results  Component Value Date   HGBA1C 5.6 12/01/2017   Urinalysis    Component Value Date/Time   COLORURINE YELLOW 11/30/2017 1937   APPEARANCEUR HAZY (A) 11/30/2017 1937   LABSPEC 1.036 (H) 11/30/2017 1937   PHURINE 5.0 11/30/2017 1937   GLUCOSEU NEGATIVE  11/30/2017 1937   HGBUR SMALL (A) 11/30/2017 1937   BILIRUBINUR NEGATIVE 11/30/2017 1937   KETONESUR NEGATIVE 11/30/2017 1937   PROTEINUR 30 (A) 11/30/2017 1937   UROBILINOGEN 0.2 01/16/2015 0931   NITRITE NEGATIVE 11/30/2017 1937   LEUKOCYTESUR MODERATE (A) 11/30/2017 1937   Urine Drug Screen     Component Value Date/Time   LABOPIA NONE DETECTED 11/30/2017 1937   COCAINSCRNUR NONE DETECTED 11/30/2017 1937   LABBENZ NONE DETECTED 11/30/2017 1937   AMPHETMU NONE DETECTED 11/30/2017 1937   THCU NONE DETECTED 11/30/2017 1937   LABBARB NONE DETECTED 11/30/2017 1937    Alcohol Level    Component Value Date/Time   ETH <10 11/30/2017 2031      Basic Metabolic Panel:  Recent Labs  Lab 12/02/17 0324 12/03/17 0458  NA 131* 134*  K 2.8* 3.9  CL 91* 99  CO2 23 26  GLUCOSE 133* 89  BUN 11 14  CREATININE 0.94 1.06*  CALCIUM 8.6* 8.1*      SIGNIFICANT DIAGNOSTIC STUDIES Ct Angio Head W Or Wo Contrast  Result Date: 11/30/2017 CLINICAL DATA:  Initial evaluation for acute stroke, left-sided gaze, aphasia. EXAM: CT ANGIOGRAPHY HEAD AND NECK TECHNIQUE: Multidetector CT imaging of the head and neck was performed using the standard protocol during bolus administration of intravenous contrast. Multiplanar CT image reconstructions and MIPs were obtained to evaluate the vascular anatomy. Carotid stenosis measurements (when applicable) are obtained utilizing NASCET criteria, using the distal internal carotid diameter as the denominator. CONTRAST:  33mL ISOVUE-370 IOPAMIDOL (ISOVUE-370) INJECTION 76% COMPARISON:  Prior noncontrast head CT from earlier the same day. FINDINGS: CTA NECK FINDINGS Aortic arch: Visualized aortic arch of normal caliber with normal branch pattern. Extensive atheromatous plaque throughout the visualized arch and about the origin of the great vessels without hemodynamically significant stenosis. Partially visualized subclavian arteries patent bilaterally. Right carotid  system: Right common carotid artery tortuous proximally but patent to the bifurcation without stenosis. Scattered calcified plaque about the right bifurcation with associated stenosis of up to 40% by NASCET criteria. Right ICA widely patent distally to the skull base without stenosis, dissection, or occlusion. Left carotid system: Left common carotid artery tortuous proximally but widely patent to the bifurcation without stenosis. Calcified eccentric plaque about the left bifurcation without hemodynamically significant stenosis. Left ICA patent distally to the skull base without  stenosis, dissection, or occlusion. Vertebral arteries: Vertebral arteries appear to arise from the subclavian arteries bilaterally. Right vertebral artery dominant, with a diffusely hypoplastic left vertebral artery. Left vertebral artery essentially occluded at its origin, with minimal scant flow within the neck. There is some distal reconstitution at the distal left V2/V3 segment, likely via muscular branches. Atheromatous plaque at the origin of the right vertebral artery with associated moderate approximate 50% stenosis. Right vert towards a sponge otherwise patent distally within the neck. Skeleton: No acute osseous abnormality. No worrisome lytic or blastic osseous lesions. C3-4 and C5-6 vertebral bodies are ankylosed. Other neck: No acute soft tissue abnormality within the neck. Small sialolith noted within the left submandibular gland. No adenopathy. Upper chest: Visualized upper chest demonstrates no acute abnormality. Partially visualized lungs are clear. Review of the MIP images confirms the above findings CTA HEAD FINDINGS Anterior circulation: Petrous segments patent bilaterally. Smooth atheromatous plaque within the cavernous/supraclinoid ICAs without significant stenosis. ICA termini widely patent. Patent right A1 segment. Hypoplastic and/or absent left A1, accounting for the diminutive left ICA is compared to the right.  Patent anterior communicating artery. Anterior cerebral arteries patent to their distal aspects without stenosis. Left M1 patent without stenosis. No proximal left M2 occlusion. Possible subtle distal small vessel occlusion at the superior aspect of the left sylvian fissure (series 7, image 79) Focal stenosis at the proximal right M1 segment with moderate diffuse narrowing distally. Normal right MCA bifurcation. No proximal right M2 occlusion. Distal right MCA branches well perfused. Distal small vessel atheromatous irregularity. Posterior circulation: Right vertebral artery dominant. Atheromatous plaque within the right V4 segment with associated moderate stenosis. Right for PICA patent. Attenuated flow within the distal left vertebral artery which is grossly patent to the takeoff of the left PICA. Left PICA grossly opacified proximally. Left vertebral artery essentially occluded beyond the left PICA. Basilar artery diminutive and mildly irregular but patent to its distal aspect without stenosis. Superior cerebral arteries patent proximally. Predominant fetal type origin of the PCAs supplied via a prominent posterior communicating arteries. PCAs patent to their distal aspects. Venous sinuses: Patent. Anatomic variants: Predominant fetal type origin of the PCAs. Delayed phase: Not performed. Review of the MIP images confirms the above findings IMPRESSION: 1. Negative CTA for large vessel occlusion. Possible distal branch/small-vessel left MCA occlusion at the superior aspect of the left sylvian fissure as above. 2. Atheromatous plaque about the carotid bifurcations bilaterally, associated 40% stenosis on the right. No significant narrowing at the left bifurcation. 3. Approximate 50% atheromatous stenosis at the origin of the dominant right vertebral artery. Left vertebral artery largely occluded within the neck, with some distal reconstitution prior to the skull base. Vertebrobasilar system overall diminutive with  fetal type origin of the PCAs bilaterally. 4. Additional mild-to-moderate age-related atheromatous disease as above. Results were discussed with Dr. Erlinda Hong, at 5:45 p.m. on 11/30/2017. Electronically Signed   By: Jeannine Boga M.D.   On: 11/30/2017 19:00   Ct Angio Neck W Or Wo Contrast  Result Date: 11/30/2017 CLINICAL DATA:  Initial evaluation for acute stroke, left-sided gaze, aphasia. EXAM: CT ANGIOGRAPHY HEAD AND NECK TECHNIQUE: Multidetector CT imaging of the head and neck was performed using the standard protocol during bolus administration of intravenous contrast. Multiplanar CT image reconstructions and MIPs were obtained to evaluate the vascular anatomy. Carotid stenosis measurements (when applicable) are obtained utilizing NASCET criteria, using the distal internal carotid diameter as the denominator. CONTRAST:  67mL ISOVUE-370 IOPAMIDOL (ISOVUE-370) INJECTION 76% COMPARISON:  Prior noncontrast head CT from earlier the same day. FINDINGS: CTA NECK FINDINGS Aortic arch: Visualized aortic arch of normal caliber with normal branch pattern. Extensive atheromatous plaque throughout the visualized arch and about the origin of the great vessels without hemodynamically significant stenosis. Partially visualized subclavian arteries patent bilaterally. Right carotid system: Right common carotid artery tortuous proximally but patent to the bifurcation without stenosis. Scattered calcified plaque about the right bifurcation with associated stenosis of up to 40% by NASCET criteria. Right ICA widely patent distally to the skull base without stenosis, dissection, or occlusion. Left carotid system: Left common carotid artery tortuous proximally but widely patent to the bifurcation without stenosis. Calcified eccentric plaque about the left bifurcation without hemodynamically significant stenosis. Left ICA patent distally to the skull base without stenosis, dissection, or occlusion. Vertebral arteries: Vertebral  arteries appear to arise from the subclavian arteries bilaterally. Right vertebral artery dominant, with a diffusely hypoplastic left vertebral artery. Left vertebral artery essentially occluded at its origin, with minimal scant flow within the neck. There is some distal reconstitution at the distal left V2/V3 segment, likely via muscular branches. Atheromatous plaque at the origin of the right vertebral artery with associated moderate approximate 50% stenosis. Right vert towards a sponge otherwise patent distally within the neck. Skeleton: No acute osseous abnormality. No worrisome lytic or blastic osseous lesions. C3-4 and C5-6 vertebral bodies are ankylosed. Other neck: No acute soft tissue abnormality within the neck. Small sialolith noted within the left submandibular gland. No adenopathy. Upper chest: Visualized upper chest demonstrates no acute abnormality. Partially visualized lungs are clear. Review of the MIP images confirms the above findings CTA HEAD FINDINGS Anterior circulation: Petrous segments patent bilaterally. Smooth atheromatous plaque within the cavernous/supraclinoid ICAs without significant stenosis. ICA termini widely patent. Patent right A1 segment. Hypoplastic and/or absent left A1, accounting for the diminutive left ICA is compared to the right. Patent anterior communicating artery. Anterior cerebral arteries patent to their distal aspects without stenosis. Left M1 patent without stenosis. No proximal left M2 occlusion. Possible subtle distal small vessel occlusion at the superior aspect of the left sylvian fissure (series 7, image 79) Focal stenosis at the proximal right M1 segment with moderate diffuse narrowing distally. Normal right MCA bifurcation. No proximal right M2 occlusion. Distal right MCA branches well perfused. Distal small vessel atheromatous irregularity. Posterior circulation: Right vertebral artery dominant. Atheromatous plaque within the right V4 segment with associated  moderate stenosis. Right for PICA patent. Attenuated flow within the distal left vertebral artery which is grossly patent to the takeoff of the left PICA. Left PICA grossly opacified proximally. Left vertebral artery essentially occluded beyond the left PICA. Basilar artery diminutive and mildly irregular but patent to its distal aspect without stenosis. Superior cerebral arteries patent proximally. Predominant fetal type origin of the PCAs supplied via a prominent posterior communicating arteries. PCAs patent to their distal aspects. Venous sinuses: Patent. Anatomic variants: Predominant fetal type origin of the PCAs. Delayed phase: Not performed. Review of the MIP images confirms the above findings IMPRESSION: 1. Negative CTA for large vessel occlusion. Possible distal branch/small-vessel left MCA occlusion at the superior aspect of the left sylvian fissure as above. 2. Atheromatous plaque about the carotid bifurcations bilaterally, associated 40% stenosis on the right. No significant narrowing at the left bifurcation. 3. Approximate 50% atheromatous stenosis at the origin of the dominant right vertebral artery. Left vertebral artery largely occluded within the neck, with some distal reconstitution prior to the skull base. Vertebrobasilar system overall diminutive  with fetal type origin of the PCAs bilaterally. 4. Additional mild-to-moderate age-related atheromatous disease as above. Results were discussed with Dr. Erlinda Hong, at 5:45 p.m. on 11/30/2017. Electronically Signed   By: Jeannine Boga M.D.   On: 11/30/2017 19:00   Mr Brain Wo Contrast  Result Date: 12/01/2017 CLINICAL DATA:  Follow-up examination for acute stroke. EXAM: MRI HEAD WITHOUT CONTRAST TECHNIQUE: Multiplanar, multiecho pulse sequences of the brain and surrounding structures were obtained without intravenous contrast. COMPARISON:  Prior CTA from 11/30/2017 FINDINGS: Brain: Examination mildly degraded by motion artifact. Generalized diffuse  prominence of the CSF containing spaces compatible with generalized age-related cerebral atrophy. Severe chronic microvascular ischemic changes seen involving the periventricular and deep white matter of both cerebral hemispheres. Chronic microvascular ischemic changes present within the pons. Superimposed remote lacunar infarcts seen involving the bilateral basal ganglia/corona radiata. Scatter remote left cerebellar infarcts noted. Encephalomalacia with volume loss at the anterior right frontal lobe also likely related to remote ischemia. Few areas of patchy restricted diffusion seen involving the cortical gray matter and underlying subcortical white matter of the left frontal and temporal occipital regions, consistent with acute left MCA territory infarcts (series 5, image 64, 56). Extension into the underlying left periatrial white matter. Associated petechial hemorrhage without evidence for hemorrhagic transformation (series 9, image 23). No significant mass effect. No other evidence for acute or subacute ischemia. Gray-white matter differentiation otherwise maintained. Few scattered chronic micro hemorrhages noted within the cerebellum, brainstem, and deep gray nuclei, most likely related to chronic underlying hypertension. No mass lesion, midline shift or mass effect. No hydrocephalus. No extra-axial fluid collection. Normal pituitary gland. Vascular: Major intracranial vascular flow voids are maintained. Skull and upper cervical spine: Craniocervical junction normal. Grade 1 anterolisthesis of C2 on C3 with associated mild spinal stenosis noted at the upper cervical spine. Bone marrow signal intensity within normal limits. No scalp soft tissue abnormality. Sinuses/Orbits: Small fluid level noted layering within the posterior left globe, indeterminate (series 7, image 10). Patient status post ocular lens replacement bilaterally. Paranasal sinuses are clear. Trace left mastoid effusion, of doubtful  significance. Other: None. IMPRESSION: 1. Patchy multifocal cortical and subcortical posterior left MCA territory infarcts as above. Associated petechial hemorrhage without frank hemorrhagic transformation. No associated mass effect. 2. Underlying age-related cerebral atrophy with severe chronic microvascular ischemic disease with chronic left cerebellar infarcts. 3. Small fluid level layering within the posterior left globe, indeterminate. Correlation with physical exam recommended. Electronically Signed   By: Jeannine Boga M.D.   On: 12/01/2017 18:11   Dg Chest Port 1 View  Result Date: 12/01/2017 CLINICAL DATA:  84 year old female.  CHF.  Initial encounter. EXAM: PORTABLE CHEST 1 VIEW COMPARISON:  05/14/2015 chest x-ray. FINDINGS: Cardiomegaly. Calcified tortuous aorta. No infiltrate, congestive heart failure or pneumothorax. Prominent right shoulder joint degenerative changes. IMPRESSION: 1. Cardiomegaly. 2.  Aortic Atherosclerosis (ICD10-I70.0).  Calcified tortuous aorta 3. No infiltrate or congestive heart failure. 4. Evaluation of retrocardiac region slightly limited although appearing similar to prior exam. Electronically Signed   By: Genia Del M.D.   On: 12/01/2017 12:20   Ct Head Code Stroke Wo Contrast  Result Date: 11/30/2017 CLINICAL DATA:  Code stroke. Initial evaluation for acute aphasia, left-sided gaze. EXAM: CT HEAD WITHOUT CONTRAST TECHNIQUE: Contiguous axial images were obtained from the base of the skull through the vertex without intravenous contrast. COMPARISON:  Prior CT from 05/14/2015. FINDINGS: Brain: Moderate cerebral atrophy. Severe chronic microvascular ischemic disease seen throughout the periventricular and deep white matter both  cerebral hemispheres. Scattered remote lacunar infarcts within the bilateral basal ganglia. Remote left cerebellar infarcts noted as well. Subtle evolving hypodensity seen involving cortical gray matter at the supra ganglionic posterior left  frontal lobe, M5 distribution, inferior left precentral gyrus (images 23, 22). No other definite evidence for acute evolving ischemic infarct. No acute intracranial hemorrhage. No mass lesion, midline shift or mass effect. No hydrocephalus. No extra-axial fluid collection. Vascular: No hyperdense vessel. Calcified atherosclerosis at the skull base. Skull: Scalp soft tissues and calvarium within normal limits. Sinuses/Orbits: Left-sided gaze noted. Paranasal sinuses are clear. No mastoid effusion. Other: None. ASPECTS [facility] Stroke Program Early CT Score) - Ganglionic level infarction (caudate, lentiform nuclei, internal capsule, insula, M1-M3 cortex): 7 - Supraganglionic infarction (M4-M6 cortex): 2 Total score (0-10 with 10 being normal): 9 IMPRESSION: 1. Subtle evolving hypodensity involving the supra ganglionic posterior left frontal lobe (M5 distribution), suspicious for evolving acute ischemic left MCA territory infarct. No associated hemorrhage. 2. ASPECTS is 9. 3. Atrophy with severe chronic microvascular ischemic disease with remote left cerebellar infarcts. Critical Value/emergent results were called by telephone at the time of interpretation on 11/30/2017 at 5:25 pm to Dr. Erlinda Hong , who verbally acknowledged these results. Electronically Signed   By: Jeannine Boga M.D.   On: 11/30/2017 17:32       HISTORY OF PRESENT ILLNESS [name] is a 84 y.o. Caucasian female with PMH of CVA 2 years ago, HTN, PAF on ASA presented for code stroke. As per EMS, pt has been doing well at independent living until 1515 11/30/2017 when she was found to have wobbly walking with a fall, did not hit head, she was subsequently found to have left forced gaze and aphasia. On EMS arrival, she still has gaze and aphsia but no focal weakness. CBG 110 and BP 150s. Code stroke called. Pt had fall in 08/2017 admitted and found to have PAF. She followed with cardiology as outpt and decided not candidate for [facility] given advance age and  falls. Pt also had mild LGIB in 08/2015 due to hemorrhoids. She is on ASA at nursing facility. Baseline AAOx3 and talking walking independently. IV tPA Given. She was admitted for further evaluation and treatment.   HOSPITAL COURSE Ms. [name] is a 84 y.o. female with history of HTN, PAF on ASA admitted for left gaze and aphasia. TPA given.  While patient tolerated TPA without difficulty, patient is tired of fighting and wants to die.  Daughters are supporting her decision.  Discharge with hospice care  Stroke:  left MCA infarct embolic secondary to afib not on [facility]  Resultant aphasia and left gaze  CT likely left MCA cortical infarct  MRI confirmed left MCA moderate sized Infarct  CTA head and neck no LVO but likely small left MCA branch occlusion, 40% right ICA stenosis, 50% right VA origin stenosis and left VA hypoplastic. B/l fetal PCAs  2D Echo EF 60-65%  HgbA1c 5.6  On diet  aspirin 81 mg daily prior to admission  Therapy recommendations:  CIR  Disposition: Plan return to [facility] tomorrow with home hospice and 24-hour care.  Family working on renovating room and getting appropriate equipment in place  DNR  Continue comfort care.   Given little to no p.o. intake and frail state, anticipate death within the next 2 weeks  PAF  Known PAF since 08/2017  Had cardiology follow up  Determined not good AC candidate due to advanced age and falls  Hypertension  Treated  with cleviprex during ICU stay   Hyperlipidemia  Home meds:  none   LDL 95, goal < 70  Other Stroke Risk Factors  Advanced age  Other Active Problems  Leukocytosis, resolved 9.8  Lives independent living PTA  Hypokalemia 3.9 supplemented  Hyponatremia Na 134   DISCHARGE EXAM Blood pressure (!) 159/84, pulse (!) 59, temperature 98.2 F (36.8 C), temperature source Oral, resp. rate 15, weight 41.1 kg, SpO2 96 %. General - frail elderly female with grimace on her face, mild  agitation.  Cardiovascular - irregularly irregular heart rate and rhythm, tele showed afib RVR.  Neuro - awake alert, eyes open, expressive ahasia but able to clearly state desires. shes 2 fingers to command, then perseverates. word salad. Follows central commands with eye closing and tongue. Eyes midline able to cross midline. Blinking to visual threat bilaterally. Tongue midline. PERRL. Moving all extremities equally, 4/5 BUEs and 3+/5 BLEs. no babinski. Sensation, coordination and gait not tested.   Discharge Diet    Diet Order            DIET DYS 3 Room service appropriate? Yes; Fluid consistency: Thin  Diet effective now             liquids  DISCHARGE PLAN  Disposition:  [facility] w/ home Hospice and 24h care  20 minutes were spent preparing discharge.  Rosalin Hawking, MD PhD Stroke Neurology 12/05/2017 9:09 PM

## 2017-12-04 NOTE — Care Management Note (Signed)
Case Management Note  Patient Details  Name: [name] MRN: [id number] Date of Birth: 1919/05/24  Subjective/Objective:                    Action/Plan: Family has decided to have patient return to [facility] with Hospice care. CM met with them and they selected to use [facility]. Olivia Mackie with [facility] met with them and has arranged for a hospital bed to be delivered tomorrow to [facility]. The family is providing caregivers so she has 24 hour care.  Pt will need [facility] for transport to [facility].   Expected Discharge Date:  12/05/17               Expected Discharge Plan:  Home w Hospice Care  In-House Referral:     Discharge planning Services  CM Consult  Post Acute Care Choice:    Choice offered to:  Adult Children  DME Arranged:    DME Agency:     HH Arranged:    HH Agency:  [facility]  Status of Service:  Completed, signed off  If discussed at [place] of Stay Meetings, dates discussed:    Additional Comments:  Pollie Friar, RN 12/04/2017, 4:42 PM

## 2017-12-04 NOTE — Progress Notes (Signed)
CSW met with patient and patient's daughter, Gay Filler, at bedside, along with [facility] liaison, Olivia Mackie. Olivia Mackie did reassess for [employer] [facility] at family's request, though [facility] maintains that patient not appropriate for their residential hospice at this time.  CSW then later met with patient and both of her daughters, Gay Filler and Margaretha Sheffield, regarding disposition planning. Patient lethargic, not fully oriented. Daughters were upset that patient does not yet qualify for [employer] [facility], and made plan for patient to return to [place] with hospice following. Margaretha Sheffield indicated she has caregivers who can help provide 24 hour care for patient.  Referred to [facility] for home hospice needs. CSW signing off.  Estanislado Emms, [place]

## 2017-12-04 NOTE — Progress Notes (Signed)
[place] [facility]) Hospital Liaison RN Visit  Notified by Jacqualin Combes, RNCM, of patient/family request for [facility] services at home after discharge. Hospice eligibility has been confirmed by [facility] physician.  Spoke with pt, [name] and [name] (daughters) at bedside to initiate education related to hospice philosophy, services and team approach to care. Family verbalized understanding of information given. Per discussion, plan is for discharge home by [facility] on Saturday December 05, 2017.  DME needs have been discussed, patient currently has a 3 in 1 in the home. Family is requesting a hospital bed with 1/2 rails and an OBT for delivery to the home. [facility] was notified and will contact Oretha Caprice (daughter) at [phone] or [phone] to arrange delivery to the home. Home address has been verified and is correct in the chart.   Please send signed and completed DNR form home with the patient/family.  Patient will need prescriptions for discharge comfort medications.  [facility] aware of the above. Completed discharge summary will need to be faxed to [facility] at [phone] when final. Please notify [facility] when patient is ready to leave the unit at discharge. Call [phone] over the weekend. [facility] information and contact numbers given to [name] and [name] during this visit.   Above information shared with [facility], RNCM.  Thank you for this referral.  Adrian Blackwater, [place] [facility] Liaison [phone]  [place] are on [place].

## 2017-12-05 MED ORDER — MORPHINE SULFATE (CONCENTRATE) 10 MG/0.5ML PO SOLN: 10.0000 mg | ORAL | 0 refills | Status: AC | PRN

## 2017-12-05 MED ORDER — LORAZEPAM 0.5 MG PO TABS: 0.5000 mg | ORAL_TABLET | ORAL | 1 refills | Status: AC | PRN

## 2017-12-05 NOTE — Progress Notes (Signed)
Patient discharged out via stretcher with ambulance transport; daughter called that patient has left the hospital; adult briefs sent with patient; no belongings in the patient's room; patient alert and cooperative to travel.

## 2017-12-05 NOTE — Progress Notes (Signed)
Discharge instruction given to patient's daughter. Patient verbalized understanding and all questions were answered.

## 2017-12-05 NOTE — Clinical Social Work Note (Addendum)
CSW spoke with Olivia Mackie with hospice. At this time pt's family is requesting referral to [place]. CSW made referral with Roxanne at [place]. CSW awaiting decision.  12:24: CSW received a call from [facility]. At this time pt is not eligible for residential hospice. Roxanne informed CSW that she spoke with [name] and at this time they want to proceed with original plan. MD aware. RN aware. Per Olivia Mackie with Hospice equipment will be delievered to pt later this evening.     Clinical Social Worker will sign off for now as social work intervention is no longer needed. Please consult [place] again if new need arises.   Shelton Silvas A Alexiz Cothran 12/05/2017

## 2017-12-05 NOTE — Clinical Social Work Note (Signed)
Clinical Social Worker facilitated patient discharge including contacting patient family and facility to confirm patient discharge plans.  Clinical information faxed to facility and family agreeable with plan.  CSW arranged ambulance transport via [facility] to [facility].    Clinical Social Worker will sign off for now as social work intervention is no longer needed. Please consult [place] again if new need arises.  [place], [place]

## 2017-12-06 DIAGNOSIS — I10 Essential (primary) hypertension: Secondary | ICD-10-CM | POA: Diagnosis not present

## 2017-12-06 DIAGNOSIS — I679 Cerebrovascular disease, unspecified: Secondary | ICD-10-CM | POA: Diagnosis not present

## 2017-12-06 DIAGNOSIS — I69951 Hemiplegia and hemiparesis following unspecified cerebrovascular disease affecting right dominant side: Secondary | ICD-10-CM | POA: Diagnosis not present

## 2017-12-06 DIAGNOSIS — I4891 Unspecified atrial fibrillation: Secondary | ICD-10-CM | POA: Diagnosis not present

## 2017-12-06 DIAGNOSIS — R4701 Aphasia: Secondary | ICD-10-CM | POA: Diagnosis not present

## 2017-12-06 DIAGNOSIS — I672 Cerebral atherosclerosis: Secondary | ICD-10-CM | POA: Diagnosis not present

## 2017-12-06 DIAGNOSIS — K219 Gastro-esophageal reflux disease without esophagitis: Secondary | ICD-10-CM | POA: Diagnosis not present

## 2017-12-06 DIAGNOSIS — M810 Age-related osteoporosis without current pathological fracture: Secondary | ICD-10-CM | POA: Diagnosis not present

## 2017-12-08 DIAGNOSIS — I4891 Unspecified atrial fibrillation: Secondary | ICD-10-CM | POA: Diagnosis not present

## 2017-12-08 DIAGNOSIS — I10 Essential (primary) hypertension: Secondary | ICD-10-CM | POA: Diagnosis not present

## 2017-12-08 DIAGNOSIS — I679 Cerebrovascular disease, unspecified: Secondary | ICD-10-CM | POA: Diagnosis not present

## 2017-12-08 DIAGNOSIS — I69951 Hemiplegia and hemiparesis following unspecified cerebrovascular disease affecting right dominant side: Secondary | ICD-10-CM | POA: Diagnosis not present

## 2017-12-08 DIAGNOSIS — R4701 Aphasia: Secondary | ICD-10-CM | POA: Diagnosis not present

## 2017-12-08 DIAGNOSIS — I672 Cerebral atherosclerosis: Secondary | ICD-10-CM | POA: Diagnosis not present

## 2017-12-10 DIAGNOSIS — I679 Cerebrovascular disease, unspecified: Secondary | ICD-10-CM | POA: Diagnosis not present

## 2017-12-10 DIAGNOSIS — I4891 Unspecified atrial fibrillation: Secondary | ICD-10-CM | POA: Diagnosis not present

## 2017-12-10 DIAGNOSIS — I672 Cerebral atherosclerosis: Secondary | ICD-10-CM | POA: Diagnosis not present

## 2017-12-10 DIAGNOSIS — I10 Essential (primary) hypertension: Secondary | ICD-10-CM | POA: Diagnosis not present

## 2017-12-10 DIAGNOSIS — R4701 Aphasia: Secondary | ICD-10-CM | POA: Diagnosis not present

## 2017-12-10 DIAGNOSIS — I69951 Hemiplegia and hemiparesis following unspecified cerebrovascular disease affecting right dominant side: Secondary | ICD-10-CM | POA: Diagnosis not present

## 2017-12-11 DIAGNOSIS — I672 Cerebral atherosclerosis: Secondary | ICD-10-CM | POA: Diagnosis not present

## 2017-12-11 DIAGNOSIS — I4891 Unspecified atrial fibrillation: Secondary | ICD-10-CM | POA: Diagnosis not present

## 2017-12-11 DIAGNOSIS — I69951 Hemiplegia and hemiparesis following unspecified cerebrovascular disease affecting right dominant side: Secondary | ICD-10-CM | POA: Diagnosis not present

## 2017-12-11 DIAGNOSIS — I10 Essential (primary) hypertension: Secondary | ICD-10-CM | POA: Diagnosis not present

## 2017-12-11 DIAGNOSIS — R4701 Aphasia: Secondary | ICD-10-CM | POA: Diagnosis not present

## 2017-12-11 DIAGNOSIS — I679 Cerebrovascular disease, unspecified: Secondary | ICD-10-CM | POA: Diagnosis not present

## 2017-12-13 DIAGNOSIS — I10 Essential (primary) hypertension: Secondary | ICD-10-CM | POA: Diagnosis not present

## 2017-12-13 DIAGNOSIS — I679 Cerebrovascular disease, unspecified: Secondary | ICD-10-CM | POA: Diagnosis not present

## 2017-12-13 DIAGNOSIS — I672 Cerebral atherosclerosis: Secondary | ICD-10-CM | POA: Diagnosis not present

## 2017-12-13 DIAGNOSIS — I4891 Unspecified atrial fibrillation: Secondary | ICD-10-CM | POA: Diagnosis not present

## 2017-12-13 DIAGNOSIS — R4701 Aphasia: Secondary | ICD-10-CM | POA: Diagnosis not present

## 2017-12-13 DIAGNOSIS — I69951 Hemiplegia and hemiparesis following unspecified cerebrovascular disease affecting right dominant side: Secondary | ICD-10-CM | POA: Diagnosis not present

## 2017-12-22 DEATH — deceased

## 2018-03-01 ENCOUNTER — Other Ambulatory Visit: Payer: Self-pay

## 2018-03-01 NOTE — Patient Outreach (Signed)
First attempt to obtain mRs."No routes found" for number listed. No other number listed for patient and no DPR on file.

## 2018-03-10 ENCOUNTER — Other Ambulatory Visit: Payer: Self-pay

## 2018-03-10 NOTE — Patient Outreach (Signed)
Second attempt to obtain mRs. "No routes found". Will try again. No DPR on file.

## 2018-03-16 ENCOUNTER — Other Ambulatory Visit: Payer: Self-pay

## 2018-03-16 NOTE — Patient Outreach (Signed)
3 outreach attempts were completed to obtain mRs. mRs could not be obtained because patient never returned my calls. MRs=7

## 2018-10-13 IMAGING — MR MR HEAD W/O CM
10 of 11 series · 42 of 48 positions shown · non-contrast
Comparison: Prior CTA from 11/30/2017

CLINICAL DATA: Follow-up examination for acute stroke.

EXAM:
MRI HEAD WITHOUT CONTRAST
TECHNIQUE: Multiplanar, multiecho pulse sequences of the brain and surrounding
structures were obtained without intravenous contrast.

[Series 5: DWI · axial · 4.0mm · 0.92mm/px · z∈[-47,+89]mm · 8 of 72 slices shown (1 of 4)]
[im 1/72]
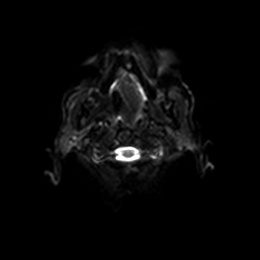
[im 11/72]
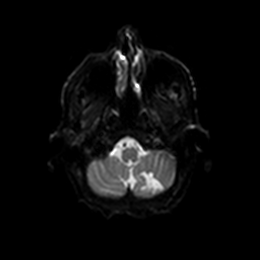
[im 21/72]
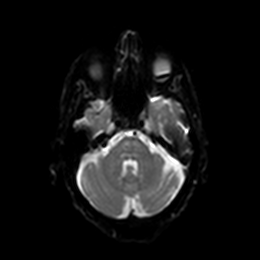
[im 31/72]
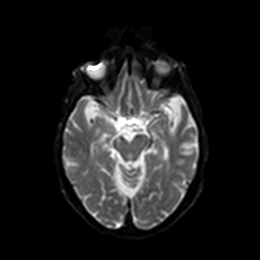
[im 41/72]
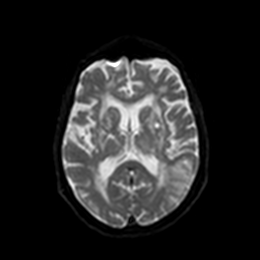
[im 51/72]
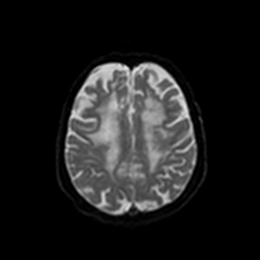
[im 61/72]
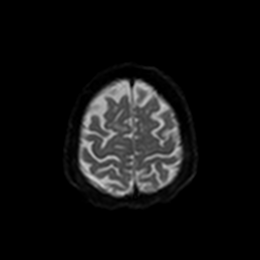
[im 72/72]
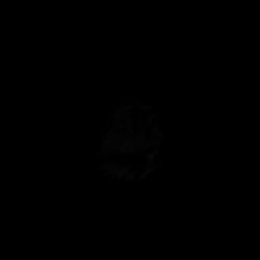

[Series 6: DWI · axial · 4.0mm · 0.92mm/px · z∈[-47,+89]mm · 4 of 36 slices shown (2 of 4)]
[im 1/36]
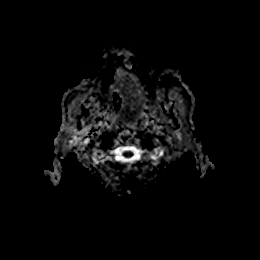
[im 12/36]
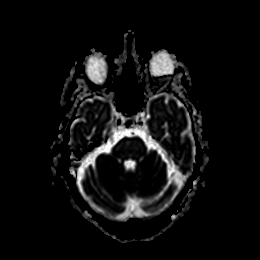
[im 24/36]
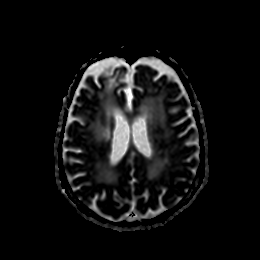
[im 36/36]
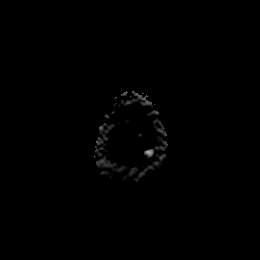

[Series 7: T2 · axial · 5.0mm · 0.75mm/px · z∈[-55,+86]mm · 3 of 25 slices shown (1 of 2)]
[im 1/25]
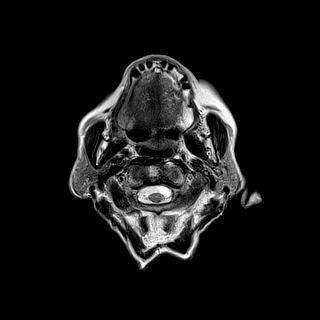
[im 13/25]
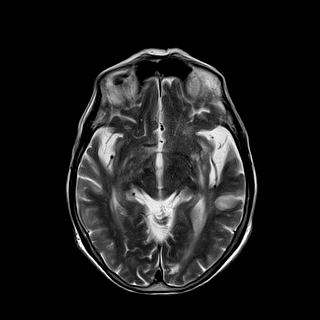
[im 25/25]
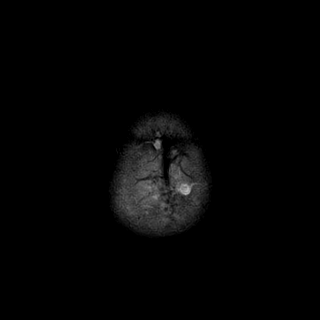

[Series 8: FLAIR · axial · 5.0mm · 0.47mm/px · z∈[-55,+86]mm · 3 of 25 slices shown]
[im 1/25]
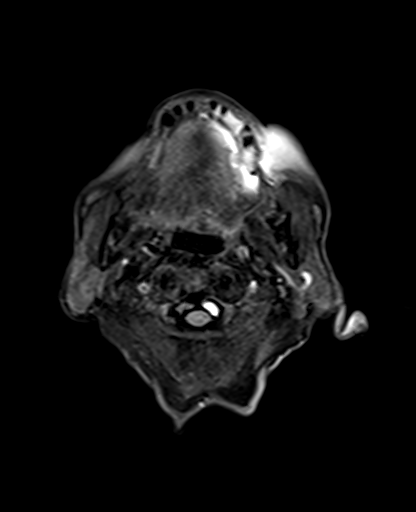
[im 13/25]
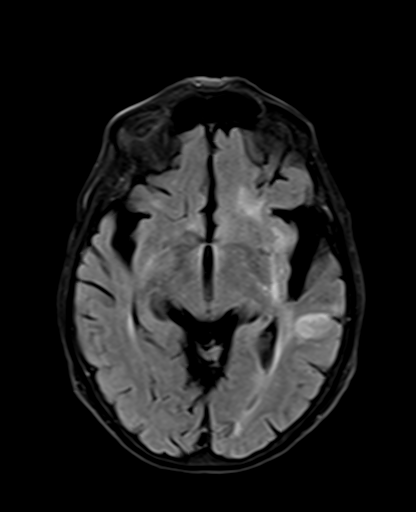
[im 25/25]
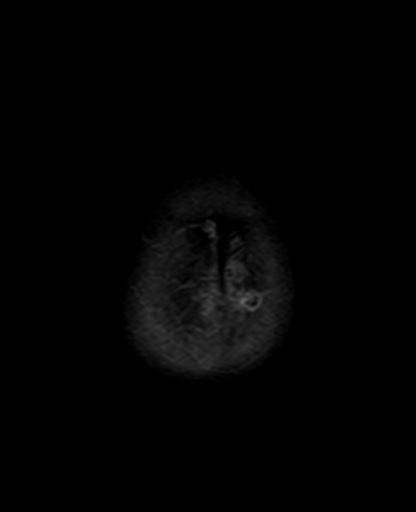

[Series 9: swi_images · axial · 3.0mm · 0.94mm/px · z∈[-52,+86]mm · 5 of 48 slices shown]
[im 1/48]
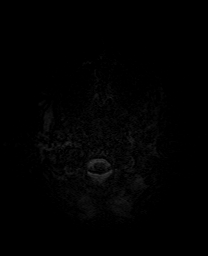
[im 12/48]
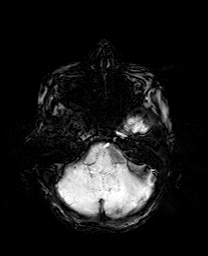
[im 24/48]
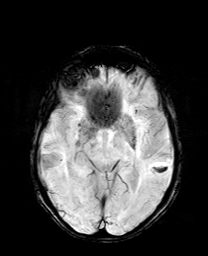
[im 36/48]
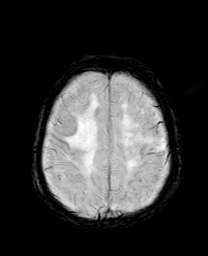
[im 48/48]
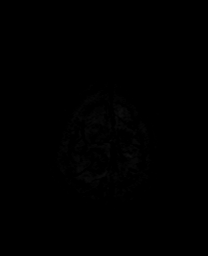

[Series 10: mip_images(sw) · axial · 24.0mm · 0.94mm/px · z∈[-42,+34]mm · 3 of 41 slices shown]
[im 1/41]
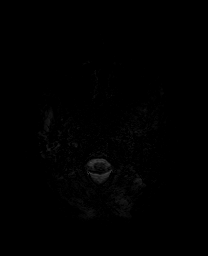
[im 14/41]
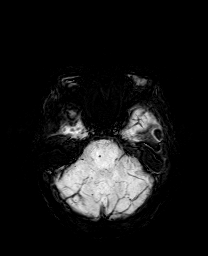
[im 27/41]
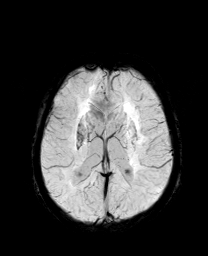

[Series 11: DWI · coronal · 4.0mm · 0.88mm/px · 7 of 64 slices shown (3 of 4)]
[im 1/64]
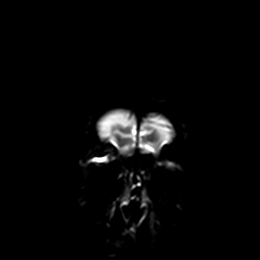
[im 11/64]
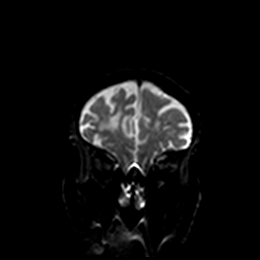
[im 22/64]
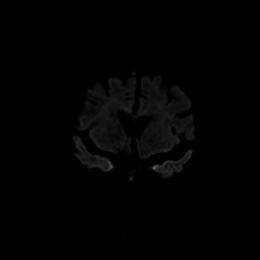
[im 32/64]
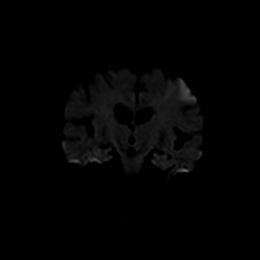
[im 43/64]
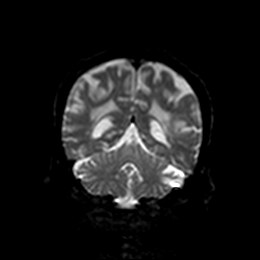
[im 53/64]
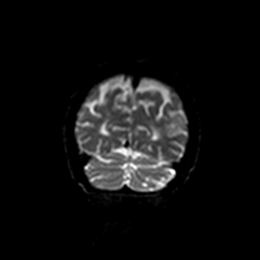
[im 64/64]
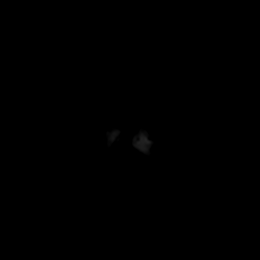

[Series 12: DWI · coronal · 4.0mm · 0.88mm/px · 3 of 32 slices shown (4 of 4)]
[im 1/32]
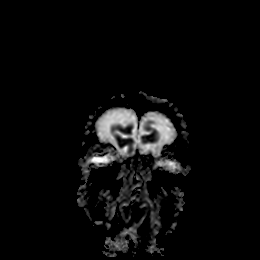
[im 16/32]
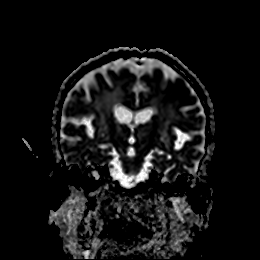
[im 32/32]
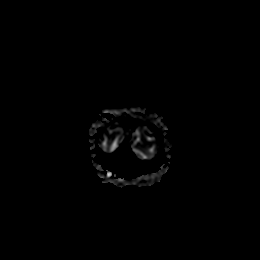

[Series 13: T1 · sagittal · 5.0mm · 0.75mm/px · 3 of 23 slices shown]
[im 1/23]
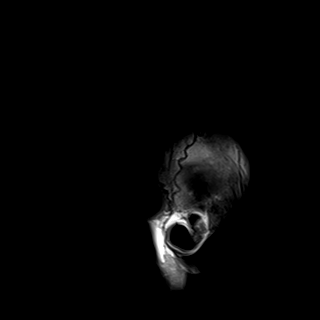
[im 12/23]
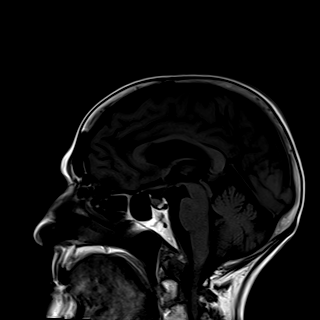
[im 23/23]
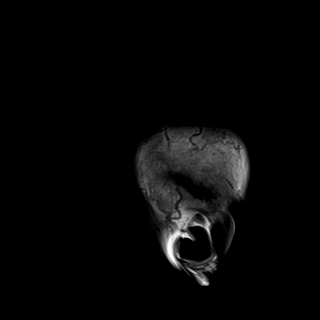

[Series 15: T2 · coronal · 5.0mm · 0.34mm/px · 3 of 26 slices shown (2 of 2)]
[im 1/26]
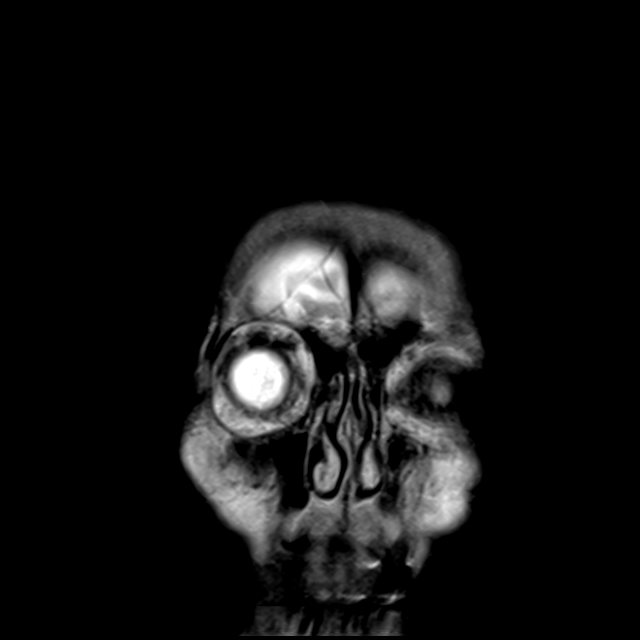
[im 13/26]
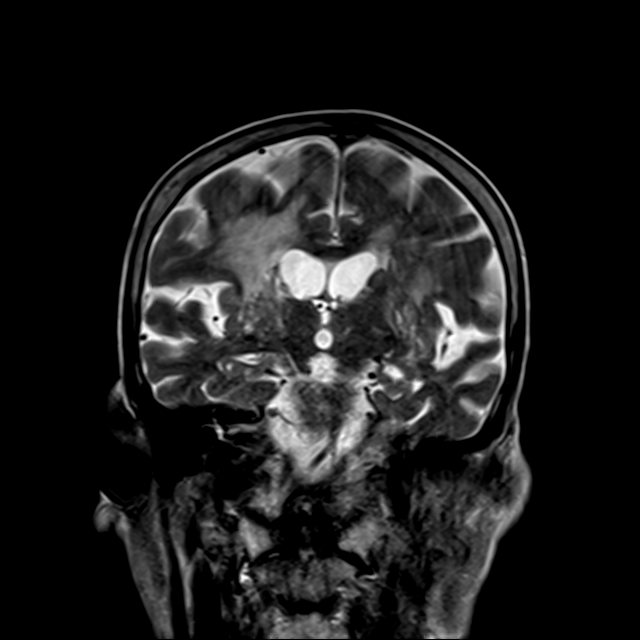
[im 26/26]
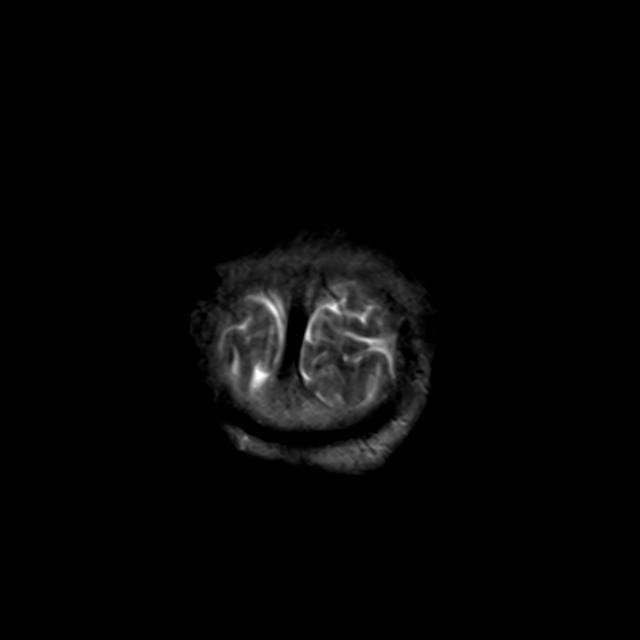

[42 of 48 positions shown; findings below may reference images not displayed]

FINDINGS: Brain: Examination mildly degraded by motion artifact.

Generalized diffuse prominence of the CSF containing spaces
compatible with generalized age-related cerebral atrophy. Severe
chronic microvascular ischemic changes seen involving the
periventricular and deep white matter of both cerebral hemispheres.
Chronic microvascular ischemic changes present within the pons.
Superimposed remote lacunar infarcts seen involving the bilateral
basal ganglia/corona radiata. Scatter remote left cerebellar
infarcts noted. Encephalomalacia with volume loss at the anterior
right frontal lobe also likely related to remote ischemia.

Few areas of patchy restricted diffusion seen involving the cortical
gray matter and underlying subcortical white matter of the left
frontal and temporal occipital regions, consistent with acute left
MCA territory infarcts (series 5, image 64, 56). Extension into the
underlying left periatrial white matter. Associated petechial
hemorrhage without evidence for hemorrhagic transformation (series
9, image 23). No significant mass effect. No other evidence for
acute or subacute ischemia. Gray-white matter differentiation
otherwise maintained. Few scattered chronic micro hemorrhages noted
within the cerebellum, brainstem, and deep gray nuclei, most likely
related to chronic underlying hypertension.

No mass lesion, midline shift or mass effect. No hydrocephalus. No
extra-axial fluid collection. Normal pituitary gland.

Vascular: Major intracranial vascular flow voids are maintained.

Skull and upper cervical spine: Craniocervical junction normal.
Grade 1 anterolisthesis of C2 on C3 with associated mild spinal
stenosis noted at the upper cervical spine. Bone marrow signal
intensity within normal limits. No scalp soft tissue abnormality.

Sinuses/Orbits: Small fluid level noted layering within the
posterior left globe, indeterminate (series 7, image 10). Patient
status post ocular lens replacement bilaterally. Paranasal sinuses
are clear. Trace left mastoid effusion, of doubtful significance.

Other: None.
IMPRESSION: 1. Patchy multifocal cortical and subcortical posterior left MCA
territory infarcts as above. Associated petechial hemorrhage without
frank hemorrhagic transformation. No associated mass effect.
2. Underlying age-related cerebral atrophy with severe chronic
microvascular ischemic disease with chronic left cerebellar
infarcts.
3. Small fluid level layering within the posterior left globe,
indeterminate. Correlation with physical exam recommended.
